# Patient Record
Sex: Male | Born: 1950 | Race: White | Hispanic: No | Marital: Single
Health system: Midwestern US, Community
[De-identification: ages and names within clinical notes are randomized; demographics above are authoritative.]

## PROBLEM LIST (undated history)

## (undated) DIAGNOSIS — I639 Cerebral infarction, unspecified: Secondary | ICD-10-CM

## (undated) DIAGNOSIS — I2510 Atherosclerotic heart disease of native coronary artery without angina pectoris: Secondary | ICD-10-CM

## (undated) DIAGNOSIS — K219 Gastro-esophageal reflux disease without esophagitis: Secondary | ICD-10-CM

## (undated) DIAGNOSIS — E7800 Pure hypercholesterolemia, unspecified: Secondary | ICD-10-CM

## (undated) DIAGNOSIS — F419 Anxiety disorder, unspecified: Secondary | ICD-10-CM

## (undated) DIAGNOSIS — I5189 Other ill-defined heart diseases: Secondary | ICD-10-CM

## (undated) DIAGNOSIS — I4891 Unspecified atrial fibrillation: Secondary | ICD-10-CM

## (undated) DIAGNOSIS — Z950 Presence of cardiac pacemaker: Secondary | ICD-10-CM

## (undated) HISTORY — DX: Anxiety disorder, unspecified: F41.9

## (undated) HISTORY — PX: OTHER SURGICAL HISTORY: SHX169

## (undated) HISTORY — PX: PACEMAKER INSERTION: SHX728

## (undated) HISTORY — DX: Atherosclerotic heart disease of native coronary artery without angina pectoris: I25.10

## (undated) HISTORY — DX: Pure hypercholesterolemia, unspecified: E78.00

## (undated) HISTORY — DX: Unspecified atrial fibrillation: I48.91

## (undated) HISTORY — PX: INGUINAL HERNIA REPAIR: SUR1180

## (undated) HISTORY — DX: Gastro-esophageal reflux disease without esophagitis: K21.9

## (undated) HISTORY — PX: CHOLECYSTECTOMY: SHX55

---

## 2012-07-23 ENCOUNTER — Emergency Department (HOSPITAL_COMMUNITY)
Admission: EM | Admit: 2012-07-23 | Discharge: 2012-07-23 | Disposition: A | Discharge status: Home / Self Care | Payer: Self-pay | Attending: Emergency Medicine | Admitting: Emergency Medicine

## 2012-07-23 ENCOUNTER — Other Ambulatory Visit: Payer: Self-pay

## 2012-07-23 ENCOUNTER — Encounter (HOSPITAL_COMMUNITY): Payer: Self-pay | Admitting: *Deleted

## 2012-07-23 ENCOUNTER — Emergency Department (HOSPITAL_COMMUNITY): Payer: Self-pay

## 2012-07-23 ENCOUNTER — Emergency Department (HOSPITAL_COMMUNITY)
Admission: EM | Admit: 2012-07-23 | Discharge: 2012-07-24 | Disposition: A | Discharge status: Home / Self Care | Payer: Self-pay | Attending: Emergency Medicine | Admitting: Emergency Medicine

## 2012-07-23 DIAGNOSIS — R55 Syncope and collapse: Secondary | ICD-10-CM | POA: Insufficient documentation

## 2012-07-23 DIAGNOSIS — Z9889 Other specified postprocedural states: Secondary | ICD-10-CM | POA: Insufficient documentation

## 2012-07-23 DIAGNOSIS — R079 Chest pain, unspecified: Secondary | ICD-10-CM | POA: Insufficient documentation

## 2012-07-23 DIAGNOSIS — R0602 Shortness of breath: Secondary | ICD-10-CM | POA: Insufficient documentation

## 2012-07-23 DIAGNOSIS — Z950 Presence of cardiac pacemaker: Secondary | ICD-10-CM | POA: Insufficient documentation

## 2012-07-23 DIAGNOSIS — Z7982 Long term (current) use of aspirin: Secondary | ICD-10-CM | POA: Insufficient documentation

## 2012-07-23 DIAGNOSIS — Z87891 Personal history of nicotine dependence: Secondary | ICD-10-CM | POA: Insufficient documentation

## 2012-07-23 DIAGNOSIS — R42 Dizziness and giddiness: Secondary | ICD-10-CM | POA: Insufficient documentation

## 2012-07-23 DIAGNOSIS — I252 Old myocardial infarction: Secondary | ICD-10-CM | POA: Insufficient documentation

## 2012-07-23 DIAGNOSIS — I2510 Atherosclerotic heart disease of native coronary artery without angina pectoris: Secondary | ICD-10-CM | POA: Insufficient documentation

## 2012-07-23 DIAGNOSIS — R404 Transient alteration of awareness: Secondary | ICD-10-CM | POA: Insufficient documentation

## 2012-07-23 HISTORY — DX: Atherosclerotic heart disease of native coronary artery without angina pectoris: I25.10

## 2012-07-23 LAB — CBC
HCT: 34.6 % — ABNORMAL LOW (ref 39.0–52.0)
Hemoglobin: 12.2 g/dL — ABNORMAL LOW (ref 13.0–17.0)
MCH: 29 pg (ref 26.0–34.0)
MCHC: 31.8 g/dL (ref 30.0–36.0)
MCHC: 31.8 g/dL (ref 30.0–36.0)
MCV: 91.5 fL (ref 78.0–100.0)
MCV: 91.4 fL (ref 78.0–100.0)
RDW: 14.5 % (ref 11.5–15.5)

## 2012-07-23 LAB — COMPREHENSIVE METABOLIC PANEL
Alkaline Phosphatase: 61 U/L (ref 39–117)
BUN: 14 mg/dL (ref 6–23)
Calcium: 9.2 mg/dL (ref 8.4–10.5)
GFR calc Af Amer: 88 mL/min — ABNORMAL LOW (ref >90)
GFR calc non Af Amer: 76 mL/min — ABNORMAL LOW (ref >90)
Glucose, Bld: 124 mg/dL — ABNORMAL HIGH (ref 70–99)
Total Protein: 7.1 g/dL (ref 6.0–8.3)

## 2012-07-23 LAB — BASIC METABOLIC PANEL
BUN: 13 mg/dL (ref 6–23)
Calcium: 9.3 mg/dL (ref 8.4–10.5)
Creatinine, Ser: 0.97 mg/dL (ref 0.50–1.35)
GFR calc non Af Amer: 86 mL/min — ABNORMAL LOW (ref >90)
Glucose, Bld: 101 mg/dL — ABNORMAL HIGH (ref 70–99)
Sodium: 140 mEq/L (ref 135–145)

## 2012-07-23 LAB — POCT I-STAT TROPONIN I
Troponin i, poc: 0 ng/mL (ref 0.00–0.08)
Troponin i, poc: 0 ng/mL (ref 0.00–0.08)

## 2012-07-23 MED ORDER — FENTANYL CITRATE 0.05 MG/ML IJ SOLN
100.0000 ug | Freq: Once | INTRAMUSCULAR | Status: AC
  Administered 2012-07-23: 100 ug via INTRAVENOUS
  Filled 2012-07-23: qty 2

## 2012-07-23 MED ORDER — ASPIRIN 325 MG PO TABS
325.0000 mg | ORAL_TABLET | Freq: Once | ORAL | Status: AC
  Administered 2012-07-23: 325 mg via ORAL
  Filled 2012-07-23: qty 1

## 2012-07-23 MED ORDER — NITROGLYCERIN 0.4 MG SL SUBL
0.4000 mg | SUBLINGUAL_TABLET | SUBLINGUAL | Status: DC | PRN
  Administered 2012-07-23: 0.4 mg via SUBLINGUAL
  Filled 2012-07-23: qty 25

## 2012-07-23 MED ORDER — OXYCODONE-ACETAMINOPHEN 5-325 MG PO TABS
1.0000 | ORAL_TABLET | Freq: Once | ORAL | Status: AC
  Administered 2012-07-23: 1 via ORAL
  Filled 2012-07-23: qty 1

## 2012-07-23 MED ORDER — ONDANSETRON HCL 4 MG/2ML IJ SOLN
4.0000 mg | Freq: Once | INTRAMUSCULAR | Status: AC
  Administered 2012-07-23: 4 mg via INTRAVENOUS
  Filled 2012-07-23: qty 2

## 2012-07-23 NOTE — ED Provider Notes (Signed)
History     CSN: [id number]  Arrival date & time 07/23/12  1422   None     Chief Complaint  Patient presents with  . Loss of Consciousness  . Chest Pain   HPI chief complaint chest pain and syncope. Onset: Upon waking this morning in regards to his chest pain. Location: Home. Symptoms not improved or worsened by anything. Quality dull. No radiation. Severity mild. Timing constant in regards to chest pain. Duration greater than 8 hours. Patient denies associated shortness of breath, palpitations, leg pain, cough, hemoptysis. Regarding social history see nurse's notes. I have reviewed patient's past medical, past surgical, social history as well as medications and allergies.  Past Medical History  Diagnosis Date  . Coronary artery disease     Past Surgical History  Procedure Date  . Pacemaker insertion   . Cardiac stents times 2   . Cholecystectomy     No family history on file.  History  Substance Use Topics  . Smoking status: Former Games developer  . Smokeless tobacco: Not on file  . Alcohol Use: No      Review of Systems 10 Systems reviewed and are negative for acute change except as noted in the HPI.  Allergies  Review of patient's allergies indicates no known allergies.  Home Medications  No current outpatient prescriptions on file.  BP 121/74  Pulse 74  Temp 98 F (36.7 C) (Oral)  Resp 18  SpO2 99%  Physical Exam  Constitutional: He is oriented to person, place, and time. He appears well-developed and well-nourished. No distress.  HENT:  Head: Normocephalic.  Eyes: Conjunctivae normal are normal.  Neck: Normal range of motion. Neck supple. No JVD present.  Cardiovascular: Normal rate, regular rhythm, normal heart sounds and intact distal pulses.   No murmur heard. Pulmonary/Chest: Effort normal and breath sounds normal. No respiratory distress. He has no wheezes. He has no rales. He exhibits no tenderness.  Abdominal: Soft. Bowel sounds are normal. He  exhibits no distension and no mass. There is no tenderness. There is no rebound and no guarding.  Musculoskeletal: Normal range of motion. He exhibits no edema and no tenderness.  Neurological: He is alert and oriented to person, place, and time.  Skin: Skin is warm and dry. He is not diaphoretic.  Psychiatric: He has a normal mood and affect.    ED Course  Procedures (including critical care time)  Labs Reviewed  CBC - Abnormal; Notable for the following:    RBC 3.78 (*)     Hemoglobin 11.0 (*)     HCT 34.6 (*)     All other components within normal limits  PRO B NATRIURETIC PEPTIDE - Abnormal; Notable for the following:    Pro B Natriuretic peptide (BNP) 321.0 (*)     All other components within normal limits  COMPREHENSIVE METABOLIC PANEL - Abnormal; Notable for the following:    Glucose, Bld 124 (*)     GFR calc non Af Amer 76 (*)     GFR calc Af Amer 88 (*)     All other components within normal limits  POCT I-STAT TROPONIN I  D-DIMER, QUANTITATIVE  POCT I-STAT TROPONIN I   Dg Chest 2 View  07/23/2012  *RADIOLOGY REPORT*  Clinical Data: Loss of consciousness.  Chest pain  CHEST - 2 VIEW  Comparison: None  Findings: The left chest wall pacer device is noted with lead in the right atrial appendage and right ventricle.  Heart size is normal.  There is no pleural effusion or edema.  No airspace consolidation identified.  Review of the visualized osseous structures is unremarkable.  IMPRESSION: No active cardiopulmonary abnormality.   Original Report Authenticated By: Signa Kell, M.D.      1. Chest pain   2. Syncope      EKG reviewed and interpreted: Atrial paced rhythm at. Normal axis. Normal intervals. T wave flattening in aVL. No ST segment changes. Normal QT interval. No evidence of WPW or Brugada syndrome. MDM  Patient is a very well-appearing 61 year old male with the reported history of myocardial infarction and stents but per her records requested from [facility]  patient has no such history. 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 Patient presents today with chest pain upon waking early this morning into syncopal episode while sitting on the couch. There is a pleuritic component to the patient's pain. Pain improved on ED. D-dimer negative so pulmonary embolism in this low-risk patient felt to be unlikely. Patient's pacemaker was interrogated which is no tenderness throughout today. Dissection doubtful. Unknown cause the patient's syncope but not felt to be cardiogenic. No new murmurs. No history of hypoglycemia. Patient denies headache so a subarachnoid hemorrhage is very doubtful at this time. No history of seizures and patient had a negative EEG several years ago. No tongue biting and incontinence although these are relatively insensitive findings. No abdominal pain. A ruptured AAA is doubtful at this time. Given chest pain has been present for more than 8 hours and patient's had 2 negative troponins I feel acute coronary syndrome is very doubtful at this time. Patient instructed to follow up with his regular physician this week and return to the emergency department should his symptoms worsen and as needed. Patient verbalized understanding.        Consuello Masse, MD 07/24/12 [phone]

## 2012-07-23 NOTE — ED Provider Notes (Signed)
I saw and evaluated the patient, reviewed the resident's note and I agree with the findings including ECG and plan.  61 year old male with history coronary artery disease and pacemaker placed a few years ago in [facility], he moved to [place] about a year ago and wants to switch cardiologist [place], he does not usually have anginal symptoms, he woke up today with a constant atypical chest sharp stabbing pain worse with cough and worse with deep breathing present all day long but when it became sharp and severe he became lightheaded and passed out briefly twice and has had mild shortness breath all day with minimal if any cough no fever no body aches no rash no confusion no focal weakness or numbness or incoordination. His chest pain is sharp stabbing pleuritic constant all day long and mild now. His pacemaker was interrogated with no abnormalities noted.  Jacob Horn, MD 07/25/12 [phone]

## 2012-07-23 NOTE — ED Notes (Signed)
Fonnie Jarvis, MD notified re: medtronic interrogation results within normal limits

## 2012-07-23 NOTE — ED Provider Notes (Signed)
History     CSN: [id number]  Arrival date & time 07/23/12  2008   First MD Initiated Contact with Patient 07/23/12 2056      Chief Complaint  Patient presents with  . Loss of Consciousness  . Chest Pain  . Dizziness  . Shortness of Breath     HPI The patient was seen earlier this evening at [facility] first syncopal episode.  There he had labs and an x-ray performed.  His pacemaker was interrogated and showed no signs of arrhythmia the patient a normal set of labs and 2 sets of troponins.  He states that after being discharged home from the emergency department he went home and felt lightheaded and passed out again.  He denies melena or hematochezia.  He has had some exertional shortness of breath but is also had chronic shortness of breath.  He denies weakness of his upper lower extremities.  He has no other complaints.   Past Medical History  Diagnosis Date  . Coronary artery disease     Past Surgical History  Procedure Date  . Pacemaker insertion   . Cardiac stents times 2   . Cholecystectomy     History reviewed. No pertinent family history.  History  Substance Use Topics  . Smoking status: Former Games developer  . Smokeless tobacco: Not on file  . Alcohol Use: No      Review of Systems  All other systems reviewed and are negative.    Allergies  Review of patient's allergies indicates no known allergies.  Home Medications   Current Outpatient Rx  Name  Route  Sig  Dispense  Refill  . ASPIRIN EC 81 MG PO TBEC   Oral   Take 81 mg by mouth daily.           BP 138/82  Pulse 71  Temp 98.2 F (36.8 C) (Oral)  Resp 14  SpO2 99%  Physical Exam  Nursing note and vitals reviewed. Constitutional: He is oriented to person, place, and time. He appears well-developed and well-nourished.  HENT:  Head: Normocephalic and atraumatic.  Eyes: EOM are normal.  Neck: Normal range of motion.  Cardiovascular: Normal rate, regular rhythm, normal heart sounds and  intact distal pulses.   Pulmonary/Chest: Effort normal and breath sounds normal. No respiratory distress.  Abdominal: Soft. He exhibits no distension. There is no tenderness.  Musculoskeletal: Normal range of motion.  Neurological: He is alert and oriented to person, place, and time.  Skin: Skin is warm and dry.  Psychiatric: He has a normal mood and affect. Judgment normal.    ED Course  Procedures (including critical care time)   Date: 07/23/2012  Rate: 72  Rhythm: Atrial paced rhythm  QRS Axis: normal  Intervals: normal  ST/T Wave abnormalities: normal  Conduction Disutrbances: none  Narrative Interpretation:   Old EKG Reviewed: No significant changes noted     Labs Reviewed  CBC - Abnormal; Notable for the following:    RBC 4.20 (*)     Hemoglobin 12.2 (*)     HCT 38.4 (*)     All other components within normal limits  BASIC METABOLIC PANEL - Abnormal; Notable for the following:    Glucose, Bld 101 (*)     GFR calc non Af Amer 86 (*)     All other components within normal limits  TROPONIN I   Dg Chest 2 View  07/23/2012  *RADIOLOGY REPORT*  Clinical Data: Loss of consciousness.  Chest pain  CHEST - 2 VIEW  Comparison: None  Findings: The left chest wall pacer device is noted with lead in the right atrial appendage and right ventricle.  Heart size is normal.  There is no pleural effusion or edema.  No airspace consolidation identified.  Review of the visualized osseous structures is unremarkable.  IMPRESSION: No active cardiopulmonary abnormality.   Original Report Authenticated By: Signa Kell, M.D.    Dg Chest Port 1 View  07/23/2012  *RADIOLOGY REPORT*  Clinical Data: Loss of consciousness, chest pain, short of breath  PORTABLE CHEST - 1 VIEW  Comparison: Chest radiograph 07/23/2012  Findings: Left-sided pacemaker with two continuous leads overlie normal cardiac silhouette.  No effusion, infiltrate, or pneumothorax.  IMPRESSION: No acute cardiopulmonary process.    Original Report Authenticated By: Genevive Bi, M.D.    I personally reviewed the imaging tests through PACS system I reviewed available ER/hospitalization records through the EMR   No diagnosis found.    MDM  There is no indication for additional workup in the emergency department.  His labs and x-ray are normal.  He ambulated in the emergency department without difficulty.  His had no arrhythmias on the monitor.  Close PCP and cardiology followup        Lyanne Co, MD 07/23/12 2359

## 2012-07-23 NOTE — ED Notes (Signed)
Pt returned home from hospital and reports becoming syncopal upon entering home. Pt has cardiac hx and has a pacemaker. Pt c/o chest pain starting last night which he reported to ED on earlier visit. Pt c/o shortness of breath w/ mild exertion. Pt c/o dizziness when sitting and is presently dizzy.

## 2012-07-23 NOTE — ED Notes (Signed)
Pt reports that he was seen at [facility] earlier this afternoon for chest pain and syncope; pt was discharged and that he went home and had another syncopal episode and continues to have chest pain.

## 2012-07-23 NOTE — ED Notes (Signed)
Pt has pacemaker and 2 stents.  Pt has been having dizziness today and has passed out twice.  Pt states both times was incontinent of urine.  Pt reports chest pain and shortness of breath

## 2012-11-26 ENCOUNTER — Emergency Department (HOSPITAL_COMMUNITY): Payer: Self-pay

## 2012-11-26 ENCOUNTER — Encounter (HOSPITAL_COMMUNITY): Payer: Self-pay | Admitting: Emergency Medicine

## 2012-11-26 ENCOUNTER — Emergency Department (HOSPITAL_COMMUNITY)
Admission: EM | Admit: 2012-11-26 | Discharge: 2012-11-26 | Disposition: A | Discharge status: Home / Self Care | Payer: Self-pay | Attending: Emergency Medicine | Admitting: Emergency Medicine

## 2012-11-26 DIAGNOSIS — R197 Diarrhea, unspecified: Secondary | ICD-10-CM | POA: Insufficient documentation

## 2012-11-26 DIAGNOSIS — Z87891 Personal history of nicotine dependence: Secondary | ICD-10-CM | POA: Insufficient documentation

## 2012-11-26 DIAGNOSIS — Z7982 Long term (current) use of aspirin: Secondary | ICD-10-CM | POA: Insufficient documentation

## 2012-11-26 DIAGNOSIS — Z950 Presence of cardiac pacemaker: Secondary | ICD-10-CM | POA: Insufficient documentation

## 2012-11-26 DIAGNOSIS — R42 Dizziness and giddiness: Secondary | ICD-10-CM | POA: Insufficient documentation

## 2012-11-26 DIAGNOSIS — R1110 Vomiting, unspecified: Secondary | ICD-10-CM | POA: Insufficient documentation

## 2012-11-26 DIAGNOSIS — R0602 Shortness of breath: Secondary | ICD-10-CM | POA: Insufficient documentation

## 2012-11-26 DIAGNOSIS — R059 Cough, unspecified: Secondary | ICD-10-CM | POA: Insufficient documentation

## 2012-11-26 DIAGNOSIS — R05 Cough: Secondary | ICD-10-CM | POA: Insufficient documentation

## 2012-11-26 DIAGNOSIS — R55 Syncope and collapse: Secondary | ICD-10-CM | POA: Insufficient documentation

## 2012-11-26 DIAGNOSIS — I2510 Atherosclerotic heart disease of native coronary artery without angina pectoris: Secondary | ICD-10-CM | POA: Insufficient documentation

## 2012-11-26 LAB — BASIC METABOLIC PANEL
BUN: 10 mg/dL (ref 6–23)
Calcium: 9 mg/dL (ref 8.4–10.5)
Creatinine, Ser: 1.28 mg/dL (ref 0.50–1.35)
GFR calc Af Amer: 67 mL/min — ABNORMAL LOW (ref >90)
GFR calc non Af Amer: 58 mL/min — ABNORMAL LOW (ref >90)

## 2012-11-26 LAB — POCT I-STAT TROPONIN I

## 2012-11-26 LAB — CBC
HCT: 38.9 % — ABNORMAL LOW (ref 39.0–52.0)
MCH: 29.5 pg (ref 26.0–34.0)
MCHC: 33.9 g/dL (ref 30.0–36.0)
MCV: 87 fL (ref 78.0–100.0)
RDW: 14.1 % (ref 11.5–15.5)

## 2012-11-26 MED ORDER — SODIUM CHLORIDE 0.9 % IV BOLUS (SEPSIS)
1000.0000 mL | Freq: Once | INTRAVENOUS | Status: AC
  Administered 2012-11-26: 1000 mL via INTRAVENOUS

## 2012-11-26 MED ORDER — ONDANSETRON HCL 4 MG/2ML IJ SOLN
4.0000 mg | Freq: Once | INTRAMUSCULAR | Status: AC
  Administered 2012-11-26: 4 mg via INTRAVENOUS
  Filled 2012-11-26: qty 2

## 2012-11-26 NOTE — ED Notes (Signed)
Pt c/o CP and SOB today; pt sts syncopal episode x 2 today; pt sts some dizziness

## 2012-11-26 NOTE — ED Notes (Signed)
Patient said he started having chest pain last night and he thought it was going to go away.  His pain got severe, he had SOB, Dizziness, Diaphoresis, weakness, nausea, vomiting and diarrhea.  The patient said he had LOC today and 1200 and he also peed on himself.  The patient decided to call a friend and they brought him to the ED to be evaluated.

## 2012-11-26 NOTE — ED Provider Notes (Signed)
History     CSN: [id number]  Arrival date & time 11/26/12  1726   First MD Initiated Contact with Patient 11/26/12 1757      Chief Complaint  Patient presents with  . Chest Pain  . Shortness of Breath    (Consider location/radiation/quality/duration/timing/severity/associated sxs/prior treatment) Patient is a 62 y.o. male presenting with chest pain and shortness of breath.  Chest Pain Associated symptoms: shortness of breath   Shortness of Breath Associated symptoms: chest pain    Pt report onset of vomiting and diarrhea yesterday with pleuritic chest pain worse with cough. No fever or abdominal pain. He reports syncope x 2 at home earlier today. Last emesis was several hours prior to arrival. He has reported history of CAD s/p stents at [facility] and pacemaker placement for bradycardia several years ago. He was evaluated here for similar symptoms earlier this year and again the same day at [facility].   Past Medical History  Diagnosis Date  . Coronary artery disease     Past Surgical History  Procedure Laterality Date  . Pacemaker insertion    . Cardiac stents times 2    . Cholecystectomy      History reviewed. No pertinent family history.  History  Substance Use Topics  . Smoking status: Former Games developer  . Smokeless tobacco: Not on file  . Alcohol Use: No      Review of Systems  Respiratory: Positive for shortness of breath.   Cardiovascular: Positive for chest pain.   All other systems reviewed and are negative except as noted in HPI.   Allergies  Review of patient's allergies indicates no known allergies.  Home Medications   Current Outpatient Rx  Name  Route  Sig  Dispense  Refill  . acetaminophen (TYLENOL) 500 MG tablet   Oral   Take 1,000 mg by mouth daily as needed for pain.         Marland Kitchen aspirin EC 81 MG tablet   Oral   Take 81 mg by mouth daily.         . Multiple Vitamin (MULTIVITAMIN WITH MINERALS) TABS   Oral   Take 1 tablet by mouth daily.            BP 110/75  Pulse 72  Temp(Src) 98.2 F (36.8 C) (Oral)  Resp 16  SpO2 97%  Physical Exam  Nursing note and vitals reviewed. Constitutional: He is oriented to person, place, and time. He appears well-developed and well-nourished.  HENT:  Head: Normocephalic and atraumatic.  Eyes: EOM are normal. Pupils are equal, round, and reactive to light.  Neck: Normal range of motion. Neck supple.  Cardiovascular: Normal rate, normal heart sounds and intact distal pulses.   Pulmonary/Chest: Effort normal and breath sounds normal.  Abdominal: Bowel sounds are normal. He exhibits no distension. There is no tenderness.  Musculoskeletal: Normal range of motion. He exhibits no edema and no tenderness.  Neurological: He is alert and oriented to person, place, and time. He has normal strength. No cranial nerve deficit or sensory deficit.  Skin: Skin is warm and dry. No rash noted.  Psychiatric: He has a normal mood and affect.    ED Course  Procedures (including critical care time)  Labs Reviewed  CBC - Abnormal; Notable for the following:    HCT 38.9 (*)    All other components within normal limits  BASIC METABOLIC PANEL - Abnormal; Notable for the following:    Glucose, Bld 138 (*)    GFR  calc non Af Amer 58 (*)    GFR calc Af Amer 67 (*)    All other components within normal limits  POCT I-STAT TROPONIN I  POCT I-STAT TROPONIN I   Dg Chest 2 View  11/26/2012  *RADIOLOGY REPORT*  Clinical Data: Chest pain, short of breath  CHEST - 2 VIEW  Comparison: 07/23/2012  Findings: Left-sided pacemaker overlies normal cardiac silhouette. No effusion, infiltrate, or pneumothorax.  IMPRESSION: No acute cardiopulmonary process.   Original Report Authenticated By: Genevive Bi, M.D.      1. Syncope   2. Diarrhea       MDM   Date: 11/26/2012  Rate: 78  Rhythm: atrially paced  QRS Axis: normal  Intervals: normal  ST/T Wave abnormalities: normal  Conduction Disutrbances:none   Narrative Interpretation:   Old EKG Reviewed: unchanged  Pt with vomiting, diarrhea and pleuritic chest pain. Report syncope earlier today, not orthostatic. Pacer interrogated with no dysrhythmias as the cause. Plan second troponin, care signed out to Earley Favor, NP and Dr. Bebe Shaggy at shift change.         Telesforo Brosnahan B. Bernette Mayers, MD 11/28/12 [phone]

## 2012-11-26 NOTE — ED Notes (Signed)
Patient transported to X-ray 

## 2012-11-26 NOTE — ED Provider Notes (Signed)
  Physical Exam  BP 110/75  Pulse 72  Temp(Src) 98.2 F (36.8 C) (Oral)  Resp 16  SpO2 97%  Physical Exam S. review second troponin if negative patient is to be sent to followup with her primary care physician ED Course  Procedures  MDM Second troponin is negative, will be discharged home      Arman Filter, NP 11/26/12 2049

## 2013-02-01 LAB — METABOLIC PANEL, COMPREHENSIVE
A-G Ratio: 1 — ABNORMAL LOW (ref 1.2–3.5)
ALT (SGPT): 18 U/L (ref 12–65)
AST (SGOT): 25 U/L (ref 15–37)
Albumin: 3.6 g/dL (ref 3.2–4.6)
Alk. phosphatase: 70 U/L (ref 50–136)
Anion gap: 5 mmol/L — ABNORMAL LOW (ref 7–16)
BUN: 12 MG/DL (ref 8–23)
Bilirubin, total: 0.6 MG/DL (ref 0.2–1.1)
CO2: 25 mmol/L (ref 21–32)
Calcium: 8.9 MG/DL (ref 8.3–10.4)
Chloride: 110 mmol/L — ABNORMAL HIGH (ref 98–107)
Creatinine: 1.43 MG/DL (ref 0.8–1.5)
GFR est AA: >60 mL/min/{1.73_m2} (ref >60)
GFR est non-AA: 53 mL/min/{1.73_m2} — ABNORMAL LOW (ref >60)
Globulin: 3.5 g/dL (ref 2.3–3.5)
Glucose: 98 mg/dL (ref 65–100)
Potassium: 4.7 mmol/L (ref 3.5–5.1)
Protein, total: 7.1 g/dL (ref 6.3–8.2)
Sodium: 140 mmol/L (ref 136–145)

## 2013-02-01 LAB — CBC WITH AUTOMATED DIFF
ABS. BASOPHILS: 0 10*3/uL (ref 0.0–0.2)
ABS. EOSINOPHILS: 0.1 10*3/uL (ref 0.0–0.8)
ABS. IMM. GRANS.: 0 10*3/uL (ref 0.0–0.5)
ABS. LYMPHOCYTES: 1.6 10*3/uL (ref 0.5–4.6)
ABS. MONOCYTES: 0.5 10*3/uL (ref 0.1–1.3)
ABS. NEUTROPHILS: 4.6 10*3/uL (ref 1.7–8.2)
BASOPHILS: 0 % (ref 0.0–2.0)
EOSINOPHILS: 2 % (ref 0.5–7.8)
HCT: 39.6 % — ABNORMAL LOW (ref 41.1–50.3)
HGB: 13.1 g/dL — ABNORMAL LOW (ref 13.6–17.2)
IMMATURE GRANULOCYTES: 0.1 % (ref 0.0–5.0)
LYMPHOCYTES: 24 % (ref 13–44)
MCH: 29.6 PG (ref 26.1–32.9)
MCHC: 33.1 g/dL (ref 31.4–35.0)
MCV: 89.4 FL (ref 79.6–97.8)
MONOCYTES: 7 % (ref 4.0–12.0)
MPV: 10.5 FL — ABNORMAL LOW (ref 10.8–14.1)
NEUTROPHILS: 67 % (ref 43–78)
PLATELET: 243 10*3/uL (ref 150–450)
RBC: 4.43 M/uL (ref 4.23–5.67)
RDW: 14.1 % (ref 11.9–14.6)
WBC: 6.9 10*3/uL (ref 4.3–11.1)

## 2013-02-01 LAB — POC TROPONIN: Troponin-I (POC): 0 ng/ml (ref 0.0–0.08)

## 2013-02-01 MED ADMIN — aspirin (ASPIRIN) tablet 325 mg: ORAL | @ 18:00:00 | NDC 66553000101

## 2013-02-01 MED ADMIN — HYDROcodone-acetaminophen (NORCO) 5-325 mg per tablet 1 Tab: ORAL | @ 18:00:00 | NDC 68084036811

## 2013-02-01 MED ADMIN — albuterol (PROVENTIL VENTOLIN) nebulizer solution 5 mg: RESPIRATORY_TRACT | @ 18:00:00 | NDC 00487950101

## 2013-02-01 MED ADMIN — methylPREDNISolone (PF) (SOLU-MEDROL) injection 125 mg: INTRAVENOUS | @ 18:00:00 | NDC 00009004725

## 2013-02-01 MED FILL — ALBUTEROL SULFATE 0.083 % (0.83 MG/ML) SOLN FOR INHALATION: 2.5 mg /3 mL (0.083 %) | RESPIRATORY_TRACT | Qty: 2

## 2013-02-01 NOTE — ED Notes (Signed)
Oxygen applied as order via nasal cannula

## 2013-02-01 NOTE — ED Notes (Signed)
I have reviewed discharge instructions with the patient.  The patient verbalized understanding. Prescriptions given. Patient ambulated out of ER with no acute distress.

## 2013-02-01 NOTE — ED Notes (Signed)
Troponin redrawn, ekg done.  Resting in bed.  Reports increased pain.  Dr. Mikey Bussing aware.

## 2013-02-01 NOTE — ED Notes (Signed)
Patient resting in bed.

## 2013-02-01 NOTE — ED Notes (Signed)
Patient requested more pain meds.  Dr. Mikey Bussing aware.  No new orders at this time.

## 2013-02-01 NOTE — ED Notes (Signed)
Medication given as ordered.

## 2013-02-01 NOTE — ED Notes (Signed)
Pt reports chest pain since this AM, some pain with inspiration and SOB.

## 2013-02-01 NOTE — ED Provider Notes (Addendum)
HPI Comments: 62 yo male with SOB and chest pain with inspiration  Better with rest  No fever  No chills  Cough - non-productive    No trauma    Pt has hx of pacer-- it was checked 2 weeks ago and fine    Patient is a 62 y.o. male presenting with chest pain. The history is provided by the patient.   Chest Pain (Angina)   Episode onset: hours ago. The problem has been gradually worsening. The pain is associated with exertion. The pain is moderate. The quality of the pain is described as sharp. The pain does not radiate. The symptoms are aggravated by deep breathing. Associated symptoms include shortness of breath. Pertinent negatives include no nausea and no vomiting.        Past Medical History   Diagnosis Date   ??? CAD (coronary artery disease)         Past Surgical History   Procedure Laterality Date   ??? Hx pacemaker     ??? Hx cholecystectomy           History reviewed. No pertinent family history.     History     Social History   ??? Marital Status: SINGLE     Spouse Name: N/A     Number of Children: N/A   ??? Years of Education: N/A     Occupational History   ??? Not on file.     Social History Main Topics   ??? Smoking status: Never Smoker    ??? Smokeless tobacco: Never Used   ??? Alcohol Use: No   ??? Drug Use: No   ??? Sexually Active: Not on file     Other Topics Concern   ??? Not on file     Social History Narrative   ??? No narrative on file                  ALLERGIES: Review of patient's allergies indicates no known allergies.      Review of Systems   Constitutional: Negative.    Respiratory: Positive for chest tightness and shortness of breath.    Cardiovascular: Positive for chest pain.   Gastrointestinal: Negative.  Negative for nausea and vomiting.   Skin: Negative for pallor.       Filed Vitals:    02/01/13 1300 02/01/13 1344   BP: 118/94    Pulse: 71    Temp: 97.2 ??F (36.2 ??C)    Resp: 18    Height: 5\' 9"  (1.753 m)    Weight: 81.647 kg (180 lb)    SpO2: 99% 98%            Physical Exam   Vitals reviewed.   Constitutional: He is oriented to person, place, and time. He appears well-developed and well-nourished.   HENT:   Head: Normocephalic.   Eyes: Pupils are equal, round, and reactive to light.   Cardiovascular: Normal rate and regular rhythm.    Pulmonary/Chest: No respiratory distress. He has wheezes. He has no rales. He exhibits tenderness.   Abdominal: Soft. He exhibits no distension. There is no tenderness.   Musculoskeletal: Normal range of motion.   Neurological: He is alert and oriented to person, place, and time.   Skin: Skin is warm and dry.        MDM     Differential Diagnosis; Clinical Impression; Plan:     62 yo with chest wall pain worse with inspiration    No alleviating  No EKG changes  Trop negative    Xr Chest [facility]    02/01/2013  PORTABLE CHEST, February 01, 2013 at 1325 hours  CLINICAL HISTORY:  CHEST pain.  COMPARISON:  None.  FINDINGS:  AP erect image demonstrates no confluent infiltrate or significant pleural fluid.  The heart size is within normal limits without evidence of congestive heart failure or pneumothorax.  The bony thorax appears intact on this view.  Bipolar transvenous pacemaker is in place.      02/01/2013  IMPRESSION:  NO ACUTE CARDIO PULMONARY DISEASE IDENTIFIED.      Recent Results (from the past 8 hour(s))  -EKG, 12 LEAD, INITIAL       Collection Time 02/01/13  1:01 PM     Systolic BP mmHg                                     Diastolic BP mmHg                                    Ventricular Rate BPM          72                     Atrial Rate BPM               72                     P-R Interval ms               82                     QRS Duration ms               82                     Q-T Interval ms               364                    QTC Calculation (Bezet) ms    398                    Calculated P Axis degrees     26                     Calculated R Axis degrees     31                     Calculated T Axis degrees     50                     Diagnosis                                          -METABOLIC PANEL, COMPREHENSIVE       Collection Time 02/01/13  1:15 PM     Sodium mmol/L                 140     Range: 136 - 145 mmol/L     Potassium mmol/L  4.7     Range: 3.5 - 5.1 mmol/L     Chloride mmol/L               110 (*) Range: 98 - 107 mmol/L     CO2 mmol/L                    25      Range: 21 - 32 mmol/L     Anion gap mmol/L              5 (*)   Range: 7 - 16 mmol/L     Glucose mg/dL                 98      Range: 65 - 100 mg/dL     BUN MG/DL                     12      Range: 8 - 23 MG/DL     Creatinine MG/DL              1.61    Range: 0.8 - 1.5 MG/DL     GFR est AA WR/UEA/5.40J8      >60     Range: >60 ml/min/1.72m2     GFR est non-AA ml/min/1.64m2  53 (*)  Range: >60 ml/min/1.7m2     Calcium MG/DL                 8.9     Range: 8.3 - 10.4 MG/DL     Bilirubin, total MG/DL        0.6     Range: 0.2 - 1.1 MG/DL     ALT U/L                       18      Range: 12 - 65 U/L     AST U/L                       25      Range: 15 - 37 U/L     Alk. phosphatase U/L          70      Range: 50 - 136 U/L     Protein, total g/dL           7.1     Range: 6.3 - 8.2 g/dL     Albumin g/dL                  3.6     Range: 3.2 - 4.6 g/dL     Globulin g/dL                 3.5     Range: 2.3 - 3.5 g/dL     A-G Ratio                     1.0 (*) Range: 1.2 - 3.5    -CBC WITH AUTOMATED DIFF       Collection Time 02/01/13  1:44 PM     WBC K/uL                      6.9     Range: 4.3 - 11.1 K/uL     RBC M/uL  4.43    Range: 4.23 - 5.67 M/uL     HGB g/dL                      16.1 (*)Range: 13.6 - 17.2 g/dL     HCT %                         39.6 (*)Range: 41.1 - 50.3 %     MCV FL                        89.4    Range: 79.6 - 97.8 FL     MCH PG                        29.6    Range: 26.1 - 32.9 PG     MCHC g/dL                     09.6    Range: 31.4 - 35.0 g/dL     RDW %                         14.1    Range: 11.9 - 14.6 %     PLATELET K/uL                 243     Range: 150 - 450 K/uL      MPV FL                        10.5 (*)Range: 10.8 - 14.1 FL     DF                            AUTOMATED                NEUTROPHILS %                 67      Range: 43 - 78 %     LYMPHOCYTES %                 24      Range: 13 - 44 %     MONOCYTES %                   7       Range: 4.0 - 12.0 %     EOSINOPHILS %                 2       Range: 0.5 - 7.8 %     BASOPHILS %                   0       Range: 0.0 - 2.0 %     IMMATURE GRANULOCYTES %       0.1     Range: 0.0 - 5.0 %     ABS. NEUTROPHILS K/UL         4.6     Range: 1.7 - 8.2 K/UL     ABS. LYMPHOCYTES K/UL         1.6     Range: 0.5 - 4.6 K/UL     ABS. MONOCYTES K/UL  0.5     Range: 0.1 - 1.3 K/UL     ABS. EOSINOPHILS K/UL         0.1     Range: 0.0 - 0.8 K/UL     ABS. BASOPHILS K/UL           0.0     Range: 0.0 - 0.2 K/UL     ABS. IMM. GRANS. K/UL         0.0     Range: 0.0 - 0.5 K/UL    Repeat trop and EKG    He does have some wheezing, no infiltrate      Procedures    Recheck -- pt resting  No fever  No chills  Now with pain radiating from the left chest, down in to the LLQ  Will repeat trop, and EKG-- pt not diaphoretic, no nausea, no EKG change  -- I think he is low risk    Will treat pain, refer back to PMD

## 2013-02-02 LAB — EKG, 12 LEAD, INITIAL
Atrial Rate: 72 {beats}/min
Calculated P Axis: 26 degrees
Calculated R Axis: 31 degrees
Calculated T Axis: 50 degrees
P-R Interval: 82 ms
Q-T Interval: 364 ms
QRS Duration: 82 ms
QTC Calculation (Bezet): 398 ms
Ventricular Rate: 72 {beats}/min

## 2013-02-02 LAB — EKG, 12 LEAD, SUBSEQUENT
Atrial Rate: 468 {beats}/min
Calculated R Axis: 13 degrees
Calculated T Axis: 30 degrees
Q-T Interval: 380 ms
QRS Duration: 96 ms
QTC Calculation (Bezet): 412 ms
Ventricular Rate: 71 {beats}/min

## 2013-07-24 ENCOUNTER — Other Ambulatory Visit: Payer: Self-pay

## 2013-07-24 ENCOUNTER — Inpatient Hospital Stay (HOSPITAL_COMMUNITY)
Admission: EM | Admit: 2013-07-24 | Discharge: 2013-07-26 | DRG: 287 | Disposition: A | Discharge status: Home / Self Care | Payer: Self-pay | Attending: Internal Medicine | Admitting: Internal Medicine

## 2013-07-24 ENCOUNTER — Encounter (HOSPITAL_COMMUNITY): Payer: Self-pay | Admitting: Emergency Medicine

## 2013-07-24 ENCOUNTER — Emergency Department (HOSPITAL_COMMUNITY): Payer: Self-pay

## 2013-07-24 DIAGNOSIS — R0789 Other chest pain: Principal | ICD-10-CM | POA: Diagnosis present

## 2013-07-24 DIAGNOSIS — Z87898 Personal history of other specified conditions: Secondary | ICD-10-CM

## 2013-07-24 DIAGNOSIS — Z950 Presence of cardiac pacemaker: Secondary | ICD-10-CM

## 2013-07-24 DIAGNOSIS — I959 Hypotension, unspecified: Secondary | ICD-10-CM | POA: Diagnosis present

## 2013-07-24 DIAGNOSIS — Z8679 Personal history of other diseases of the circulatory system: Secondary | ICD-10-CM

## 2013-07-24 DIAGNOSIS — Z9861 Coronary angioplasty status: Secondary | ICD-10-CM

## 2013-07-24 DIAGNOSIS — R55 Syncope and collapse: Secondary | ICD-10-CM

## 2013-07-24 DIAGNOSIS — R42 Dizziness and giddiness: Secondary | ICD-10-CM

## 2013-07-24 DIAGNOSIS — Z87891 Personal history of nicotine dependence: Secondary | ICD-10-CM

## 2013-07-24 DIAGNOSIS — I2510 Atherosclerotic heart disease of native coronary artery without angina pectoris: Secondary | ICD-10-CM

## 2013-07-24 DIAGNOSIS — N182 Chronic kidney disease, stage 2 (mild): Secondary | ICD-10-CM | POA: Diagnosis present

## 2013-07-24 DIAGNOSIS — I200 Unstable angina: Secondary | ICD-10-CM

## 2013-07-24 DIAGNOSIS — R079 Chest pain, unspecified: Secondary | ICD-10-CM

## 2013-07-24 DIAGNOSIS — Z7982 Long term (current) use of aspirin: Secondary | ICD-10-CM

## 2013-07-24 DIAGNOSIS — I129 Hypertensive chronic kidney disease with stage 1 through stage 4 chronic kidney disease, or unspecified chronic kidney disease: Secondary | ICD-10-CM | POA: Diagnosis present

## 2013-07-24 DIAGNOSIS — R21 Rash and other nonspecific skin eruption: Secondary | ICD-10-CM

## 2013-07-24 HISTORY — DX: Presence of cardiac pacemaker: Z95.0

## 2013-07-24 HISTORY — DX: Other ill-defined heart diseases: I51.89

## 2013-07-24 LAB — TROPONIN I: Troponin I: <0.3 ng/mL (ref <0.30)

## 2013-07-24 LAB — POCT I-STAT, CHEM 8
BUN: 10 mg/dL (ref 6–23)
CALCIUM ION: 1.29 mmol/L (ref 1.13–1.30)
CHLORIDE: 103 meq/L (ref 96–112)
Creatinine, Ser: 1.2 mg/dL (ref 0.50–1.35)
GLUCOSE: 94 mg/dL (ref 70–99)
HEMATOCRIT: 42 % (ref 39.0–52.0)
HEMOGLOBIN: 14.3 g/dL (ref 13.0–17.0)
Potassium: 3.9 mEq/L (ref 3.7–5.3)
Sodium: 142 mEq/L (ref 137–147)
TCO2: 26 mmol/L (ref 0–100)

## 2013-07-24 LAB — CBC
HCT: 40.8 % (ref 39.0–52.0)
Hemoglobin: 13.7 g/dL (ref 13.0–17.0)
MCH: 30.4 pg (ref 26.0–34.0)
MCHC: 33.6 g/dL (ref 30.0–36.0)
MCV: 90.5 fL (ref 78.0–100.0)
PLATELETS: 203 10*3/uL (ref 150–400)
RBC: 4.51 MIL/uL (ref 4.22–5.81)
RDW: 14.1 % (ref 11.5–15.5)
WBC: 6.9 10*3/uL (ref 4.0–10.5)

## 2013-07-24 LAB — BASIC METABOLIC PANEL
BUN: 11 mg/dL (ref 6–23)
CALCIUM: 9.1 mg/dL (ref 8.4–10.5)
CHLORIDE: 105 meq/L (ref 96–112)
CO2: 25 meq/L (ref 19–32)
Creatinine, Ser: 1.1 mg/dL (ref 0.50–1.35)
GFR calc non Af Amer: 69 mL/min — ABNORMAL LOW (ref >90)
GFR, EST AFRICAN AMERICAN: 80 mL/min — AB (ref >90)
Glucose, Bld: 92 mg/dL (ref 70–99)
Potassium: 4.2 mEq/L (ref 3.7–5.3)
SODIUM: 141 meq/L (ref 137–147)

## 2013-07-24 LAB — POCT I-STAT TROPONIN I: TROPONIN I, POC: 0 ng/mL (ref 0.00–0.08)

## 2013-07-24 MED ORDER — NITROGLYCERIN 0.4 MG SL SUBL
0.4000 mg | SUBLINGUAL_TABLET | SUBLINGUAL | Status: DC | PRN
  Administered 2013-07-24 (×3): 0.4 mg via SUBLINGUAL
  Filled 2013-07-24: qty 25

## 2013-07-24 MED ORDER — ONDANSETRON HCL 4 MG/2ML IJ SOLN: 4.0000 mg | Freq: Four times a day (QID) | INTRAMUSCULAR | Status: DC | PRN

## 2013-07-24 MED ORDER — HEPARIN SODIUM (PORCINE) 5000 UNIT/ML IJ SOLN
5000.0000 [IU] | Freq: Three times a day (TID) | INTRAMUSCULAR | Status: DC
  Administered 2013-07-24 – 2013-07-25 (×2): 5000 [IU] via SUBCUTANEOUS
  Filled 2013-07-24 (×5): qty 1

## 2013-07-24 MED ORDER — SODIUM CHLORIDE 0.9 % IJ SOLN: 3.0000 mL | INTRAMUSCULAR | Status: DC | PRN

## 2013-07-24 MED ORDER — ACETAMINOPHEN 325 MG PO TABS: 650.0000 mg | ORAL_TABLET | Freq: Four times a day (QID) | ORAL | Status: DC | PRN

## 2013-07-24 MED ORDER — ACETAMINOPHEN 650 MG RE SUPP: 650.0000 mg | Freq: Four times a day (QID) | RECTAL | Status: DC | PRN

## 2013-07-24 MED ORDER — ASPIRIN EC 81 MG PO TBEC
81.0000 mg | DELAYED_RELEASE_TABLET | Freq: Every day | ORAL | Status: DC
  Filled 2013-07-24: qty 1

## 2013-07-24 MED ORDER — SODIUM CHLORIDE 0.9 % IJ SOLN: 3.0000 mL | Freq: Two times a day (BID) | INTRAMUSCULAR | Status: DC

## 2013-07-24 MED ORDER — ONDANSETRON HCL 4 MG PO TABS: 4.0000 mg | ORAL_TABLET | Freq: Four times a day (QID) | ORAL | Status: DC | PRN

## 2013-07-24 MED ORDER — ADULT MULTIVITAMIN W/MINERALS CH
1.0000 | ORAL_TABLET | Freq: Every day | ORAL | Status: DC
Start: 2013-07-24 — End: 2013-07-26
  Administered 2013-07-24 – 2013-07-26 (×3): 1 via ORAL
  Filled 2013-07-24 (×3): qty 1

## 2013-07-24 MED ORDER — HYDROCODONE-ACETAMINOPHEN 5-325 MG PO TABS: 1.0000 | ORAL_TABLET | ORAL | Status: DC | PRN

## 2013-07-24 MED ORDER — ASPIRIN 81 MG PO CHEW
81.0000 mg | CHEWABLE_TABLET | ORAL | Status: AC
  Administered 2013-07-25: 81 mg via ORAL
  Filled 2013-07-24: qty 1

## 2013-07-24 MED ORDER — SODIUM CHLORIDE 0.9 % IV SOLN: 250.0000 mL | INTRAVENOUS | Status: DC | PRN

## 2013-07-24 MED ORDER — KETOROLAC TROMETHAMINE 30 MG/ML IJ SOLN
30.0000 mg | Freq: Once | INTRAMUSCULAR | Status: AC
  Administered 2013-07-24: 30 mg via INTRAVENOUS
  Filled 2013-07-24: qty 1

## 2013-07-24 MED ORDER — HYDROCERIN EX CREA
TOPICAL_CREAM | Freq: Two times a day (BID) | CUTANEOUS | Status: DC
  Administered 2013-07-24 – 2013-07-26 (×4): via TOPICAL
  Filled 2013-07-24: qty 113

## 2013-07-24 MED ORDER — NITROGLYCERIN IN D5W 200-5 MCG/ML-% IV SOLN
10.0000 ug/min | INTRAVENOUS | Status: DC
  Administered 2013-07-24: 10 ug/min via INTRAVENOUS
  Filled 2013-07-24: qty 250

## 2013-07-24 MED ORDER — ASPIRIN EC 81 MG PO TBEC
81.0000 mg | DELAYED_RELEASE_TABLET | Freq: Every day | ORAL | Status: DC
  Administered 2013-07-26: 09:00:00 81 mg via ORAL
  Filled 2013-07-24: qty 1

## 2013-07-24 MED ORDER — ASPIRIN EC 81 MG PO TBEC
81.0000 mg | DELAYED_RELEASE_TABLET | Freq: Once | ORAL | Status: AC
  Administered 2013-07-24: 81 mg via ORAL
  Filled 2013-07-24: qty 1

## 2013-07-24 MED ORDER — MORPHINE SULFATE 4 MG/ML IJ SOLN
4.0000 mg | Freq: Once | INTRAMUSCULAR | Status: AC
  Administered 2013-07-24: 4 mg via INTRAVENOUS
  Filled 2013-07-24: qty 1

## 2013-07-24 NOTE — ED Notes (Signed)
Paged IV team 

## 2013-07-24 NOTE — ED Notes (Signed)
MD at bedside. 

## 2013-07-24 NOTE — ED Notes (Signed)
Spoke with IV team will attempt IV.  

## 2013-07-24 NOTE — ED Notes (Signed)
Chest pain pressure currently 6/10. Resting comfortably on stretcher.

## 2013-07-24 NOTE — ED Notes (Signed)
IV team at bedside 

## 2013-07-24 NOTE — ED Notes (Addendum)
Pt. States Chest pressure 8 out of 10 with change in LOC witnessed by a friend around 09:30. Meds 1 SL Nitro and 1 ASA. Medtronic PPM placed 2012 on left chest.

## 2013-07-24 NOTE — ED Notes (Signed)
Cardiologist at bedside.  

## 2013-07-24 NOTE — Consult Note (Signed)
CARDIOLOGY CONSULT NOTE       Patient ID: Jacob Pineda MRN: [id number] DOB/AGE: 65-May-1952 65 y.o.  Admit date: 07/24/2013 Referring Physician:  Josem Kaufmann Primary Physician: Default, Provider, MD Primary Cardiologist:  [facility] Reason for Consultation:  Chest Pain and syncope  Active Problems:   Unstable angina   HPI:   63 yo with known CAD  Describes having stents placed to 2 different arteries at [facility] in 2012  Sees them for pacer f/u as well.  Has not had repeat stress testing or cath since.  Had gotten out of shower this am about 9:30 and "passed out" with no warning  Did not have dyspnea, palpitations or chest pain prior to passing out.  ? Out for 5 minutes.  Does not think he hurt himself but then started having chest pain.  Left side of chest radiating to axilla.  Not like pain in 2012.  In ER pain not very responsive to narcotics or nitro.  Initial ECG non acute and troponin negative despite saying he has 8/10 pain.  No calf pain.  Mild dyspnea No positional or pleuritic pain.  No history of DVT.  Currently looks comfortable but complains of 4/10 pain.  Telemetry with normal A pacing and ventricular morphology normal  ROS All other systems reviewed and negative except as noted above  Past Medical History  Diagnosis Date  . Coronary artery disease   . Pacemaker     History reviewed. No pertinent family history.  History   Social History  . Marital Status: Widowed    Spouse Name: N/A    Number of Children: N/A  . Years of Education: N/A   Occupational History  . Not on file.   Social History Main Topics  . Smoking status: Former Games developer  . Smokeless tobacco: Not on file  . Alcohol Use: No  . Drug Use: No  . Sexual Activity:    Other Topics Concern  . Not on file   Social History Narrative  . No narrative on file    Past Surgical History  Procedure Laterality Date  . Pacemaker insertion    . Cardiac stents times 2    . Cholecystectomy         .  nitroGLYCERIN 10 mcg/min (07/24/13 1852)    Physical Exam: Blood pressure 125/75, pulse 71, temperature 98.2 F (36.8 C), temperature source Oral, resp. rate 16, height 5\' 11"  (1.803 m), weight 175 lb (79.379 kg), SpO2 99.00%.   Affect appropriate Healthy:  appears stated age HEENT: normal Neck supple with no adenopathy JVP normal no bruits no thyromegaly Lungs clear with no wheezing and good diaphragmatic motion Heart:  S1/S2 no murmur, no rub, gallop or click PMI normal Abdomen: benighn, BS positve, no tenderness, no AAA no bruit.  No HSM or HJR Distal pulses intact with no bruits No edema Neuro non-focal Skin warm and dry No muscular weakness   Labs:   Lab Results  Component Value Date   WBC 6.9 07/24/2013   HGB 14.3 07/24/2013   HCT 42.0 07/24/2013   MCV 90.5 07/24/2013   PLT 203 07/24/2013    Recent Labs Lab 07/24/13 1151 07/24/13 1223  NA 141 142  K 4.2 3.9  CL 105 103  CO2 25  --   BUN 11 10  CREATININE 1.10 1.20  CALCIUM 9.1  --   GLUCOSE 92 94   Lab Results  Component Value Date   TROPONINI <0.30 07/23/2012     Radiology: Dg  Chest Port 1 View  07/24/2013   CLINICAL DATA:  Chest pain, dizziness  EXAM: PORTABLE CHEST - 1 VIEW  COMPARISON:  11/26/2012  FINDINGS: The heart size and mediastinal contours are within normal limits. There is a dual lead cardiac pacer present. Both lungs are clear. The visualized skeletal structures are unremarkable.  IMPRESSION: No active disease.   Electronically Signed   By: Elige KoHetal  Patel   On: 07/24/2013 11:53    EKG:  A pacing normal ventricular complex   ASSESSMENT AND PLAN:  Syncope:  Does not sound cardiac  Pacer seems to be working normally but will have to interogate in am Not clear if it is Camera operatormedtronic or St Jude.  Need records from [facility].  Will arrange cath and echo for am given known CAD and pain now requiring iv nitro.  Would not start heparin unless enzymes turn positive.  ASA.   CAD:  No evidence of acute event.  ASA  Check  lipids should be on statin   Signed: Charlton Hawseter Ashira Kirsten 07/24/2013, 7:06 PM

## 2013-07-24 NOTE — ED Provider Notes (Signed)
CSN: [id number]     Arrival date & time 07/24/13  1058 History   First MD Initiated Contact with Patient 07/24/13 1152     Chief Complaint  Patient presents with  . Chest Pain   (Consider location/radiation/quality/duration/timing/severity/associated sxs/prior Treatment) Patient is a 63 y.o. male presenting with chest pain.  Chest Pain  Pt with history of CAD and pacemaker placement reports he was standing in his home earlier today when he had sudden onset of syncope, fell onto a couch, no head injury. Friend on scene told him he was unconscious for about . Pt states he woke up with moderate aching pain in L chest radiating to axilla. Not associated with SOB. Improved some with NTG and ASA taken at home but still 7/10 at the time of my eval.   Past Medical History  Diagnosis Date  . Coronary artery disease    Past Surgical History  Procedure Laterality Date  . Pacemaker insertion    . Cardiac stents times 2    . Cholecystectomy     History reviewed. No pertinent family history. History  Substance Use Topics  . Smoking status: Former Games developer  . Smokeless tobacco: Not on file  . Alcohol Use: No    Review of Systems  Cardiovascular: Positive for chest pain.   All other systems reviewed and are negative except as noted in HPI.   Allergies  Review of patient's allergies indicates no known allergies.  Home Medications   Current Outpatient Rx  Name  Route  Sig  Dispense  Refill  . acetaminophen (TYLENOL) 500 MG tablet   Oral   Take 1,000 mg by mouth daily as needed for pain.         Marland Kitchen aspirin EC 81 MG tablet   Oral   Take 81 mg by mouth daily.         . Multiple Vitamin (MULTIVITAMIN WITH MINERALS) TABS   Oral   Take 1 tablet by mouth daily.          BP 112/72  Pulse 71  Temp(Src) 98.1 F (36.7 C) (Oral)  Resp 18  Ht 5\' 11"  (1.803 m)  Wt 175 lb (79.379 kg)  BMI 24.42 kg/m2  SpO2 99% Physical Exam  Nursing note and vitals reviewed. Constitutional: He  is oriented to person, place, and time. He appears well-developed and well-nourished.  HENT:  Head: Normocephalic and atraumatic.  Eyes: EOM are normal. Pupils are equal, round, and reactive to light.  Neck: Normal range of motion. Neck supple.  Cardiovascular: Normal rate, normal heart sounds and intact distal pulses.   Pulmonary/Chest: Effort normal and breath sounds normal.  Abdominal: Bowel sounds are normal. He exhibits no distension. There is no tenderness.  Musculoskeletal: Normal range of motion. He exhibits no edema and no tenderness.  Neurological: He is alert and oriented to person, place, and time. He has normal strength. No cranial nerve deficit or sensory deficit.  Skin: Skin is warm and dry. No rash noted.  Psychiatric: He has a normal mood and affect.    ED Course  Procedures (including critical care time) Labs Review Labs Reviewed  BASIC METABOLIC PANEL - Abnormal; Notable for the following:    GFR calc non Af Amer 69 (*)    GFR calc Af Amer 80 (*)    All other components within normal limits  CBC  POCT I-STAT TROPONIN I  POCT I-STAT, CHEM 8   Imaging Review Dg Chest Port 1 View  07/24/2013  CLINICAL DATA:  Chest pain, dizziness  EXAM: PORTABLE CHEST - 1 VIEW  COMPARISON:  11/26/2012  FINDINGS: The heart size and mediastinal contours are within normal limits. There is a dual lead cardiac pacer present. Both lungs are clear. The visualized skeletal structures are unremarkable.  IMPRESSION: No active disease.   Electronically Signed   By: Elige KoHetal  Patel   On: 07/24/2013 11:53    EKG Interpretation    Date/Time:  Thursday July 24 2013 11:01:56 EST Ventricular Rate:  85 PR Interval:  80 QRS Duration: 78 QT Interval:  352 QTC Calculation: 418 R Axis:   36 Text Interpretation:  Electronic atrial pacemaker No significant change since last tracing Confirmed by SHELDON  MD, CHARLES ([id number]) on 07/24/2013 12:29:54 PM            MDM   1. Chest pain   2. Syncope      Pain improved minimally with NTG, dilaudid. Will try Toradol. Admit for further eval.     Charles B. Bernette MayersSheldon, MD 07/24/13 [phone]

## 2013-07-24 NOTE — ED Notes (Signed)
Pt c/o CP with SOB and syncopal episode today; pt in no distress at present

## 2013-07-24 NOTE — H&P (Signed)
Date: 07/24/2013               Patient Name:  Jacob Pineda MRN: [id number]  DOB: [birth year] Age / Sex: 63 y.o., male   PCP: Provider Default, MD         Medical Service: Internal Medicine Teaching Service         Attending Physician: Dr. Rocco SereneLawrence D Klima, MD    First Contact: Dr. Andrey CampanileWilson Pager: [phone]  Second Contact: Dr. Dierdre SearlesLi Pager: [phone]       After Hours (After 5p/  First Contact Pager: [phone]  weekends / holidays): Second Contact Pager: [phone]   Chief Complaint: Passed out, Chest Pain  History of Present Illness: Jacob Pineda is a 63 year old while male with PMH of CAD (reports 2 stents placed in 2012), Symptomatic Bradycardia (PPM placed in 2012), and CKD Stage 2.  He reports he was in his normal state of health this morning when at around 9:30am he passed out and fell onto his couch.  He reports a friend was present and told him he was unconscious for about 4 minutes.  He denies any loss of bowel or bladder function, no tongue biting, no muscle jerking, no previous history of seizure activity.    He denies HA.  He denies any abdominal pain.  He has no history of hypoglycemia.  He denies any prodromal symptoms of dizziness, diaphoresis, nausea, palpitations, or chest pain.  He reports that after he regained consciousness he had substernal left sided chest pain that radiates to his left flank.  He reported that the pain was an 8/10 at worst, was not relieved by ASA or NTG by EMS.  He does report that currently the pain is a 4/10 after a Nitro drip was started.   Meds: Prescriptions prior to admission  Medication Sig Dispense Refill  . acetaminophen (TYLENOL) 500 MG tablet Take 1,000 mg by mouth daily as needed for pain.      Marland Kitchen. aspirin EC 81 MG tablet Take 81 mg by mouth daily.      . Multiple Vitamin (MULTIVITAMIN WITH MINERALS) TABS Take 1 tablet by mouth daily.        Allergies: Allergies as of 07/24/2013  . (No Known Allergies)   Past Medical History    Diagnosis Date  . Coronary artery disease   . Pacemaker    Past Surgical History  Procedure Laterality Date  . Pacemaker insertion    . Cardiac stents times 2    . Cholecystectomy     History reviewed. No pertinent family history. History   Social History  . Marital Status: Widowed    Spouse Name: N/A    Number of Children: N/A  . Years of Education: N/A   Occupational History  . Not on file.   Social History Main Topics  . Smoking status: Former Games developermoker  . Smokeless tobacco: Not on file  . Alcohol Use: No  . Drug Use: No  . Sexual Activity:    Other Topics Concern  . Not on file   Social History Narrative  . No narrative on file    Review of Systems: Review of Systems  Constitutional: Negative for fever, chills, weight loss, malaise/fatigue and diaphoresis.  HENT: Negative for congestion and sore throat.   Eyes: Negative for blurred vision and double vision.  Respiratory: Negative for cough and shortness of breath.   Cardiovascular: Positive for chest pain. Negative for palpitations, orthopnea, claudication and leg swelling.  Gastrointestinal:  Negative for heartburn, nausea, vomiting, abdominal pain, diarrhea, constipation and blood in stool.  Genitourinary: Negative for dysuria, urgency and frequency.  Musculoskeletal: Positive for falls.  Skin: Positive for rash (Left foot for last 3 days).  Neurological: Positive for loss of consciousness. Negative for dizziness, tremors, sensory change, speech change, focal weakness, seizures, weakness and headaches.     Physical Exam: Blood pressure 125/75, pulse 71, temperature 98.2 F (36.8 C), temperature source Oral, resp. rate 16, height 5\' 11"  (1.803 m), weight 175 lb (79.379 kg), SpO2 99.00%. Physical Exam  Nursing note and vitals reviewed. Constitutional: He is oriented to person, place, and time and well-developed, well-nourished, and in no distress.  HENT:  Head: Normocephalic and atraumatic.  Eyes:  Conjunctivae and EOM are normal.  Cardiovascular: Normal rate, regular rhythm and normal heart sounds.   No murmur heard. JVP present  Pulmonary/Chest: Effort normal. No respiratory distress. He has no wheezes. He has no rales.  Abdominal: Soft. Bowel sounds are normal. He exhibits no distension. There is no tenderness.  Musculoskeletal: He exhibits no edema.  Neurological: He is alert and oriented to person, place, and time.  Skin: Skin is warm and dry.  Psychiatric: Affect and judgment normal.     Lab results: Basic Metabolic Panel:  Recent Labs  16/10/96 1151 07/24/13 1223  NA 141 142  K 4.2 3.9  CL 105 103  CO2 25  --   GLUCOSE 92 94  BUN 11 10  CREATININE 1.10 1.20  CALCIUM 9.1  --    CBC:  Recent Labs  07/24/13 1151 07/24/13 1223  WBC 6.9  --   HGB 13.7 14.3  HCT 40.8 42.0  MCV 90.5  --   PLT 203  --    Cardiac Enzymes: ISTAT Trop: 0.00 at 1206 07/24/13   Imaging results:  Dg Chest Port 1 View  07/24/2013   CLINICAL DATA:  Chest pain, dizziness  EXAM: PORTABLE CHEST - 1 VIEW  COMPARISON:  11/26/2012  FINDINGS: The heart size and mediastinal contours are within normal limits. There is a dual lead cardiac pacer present. Both lungs are clear. The visualized skeletal structures are unremarkable.  IMPRESSION: No active disease.   Electronically Signed   By: Elige Ko   On: 07/24/2013 11:53    Other results: EKG: 07/24/13 11:01:56.  Atrial Paced, normal axis, No ST or T wave changes, unchanged from previous (11/26/12).  Assessment & Plan by Problem: Jacob Pineda is a 63 year old male with PMH of CAD, symptomatic bradycardia who was admitted for concern for unstable angina after a syncopal episode.    Unstable angina - Patient reports left side substernal chest pain radiating to flank.  Initial EKG and troponins negative, however pain not relieved by SL nitro and ASA.  - Admit to inpatient (stepdown with cardiac monitoring) - Nitro gtt - Follow CE if  positive will start heparin -AM EKG - Interrogate Medtronic pacemaker - Likely cardiac cath in AM. As patient is high risk given history of CAD and atypical chest pain. Although his TIMI risk score is 2. - Risk stratify with Lipid Panel, A1C, TSH. - Patient would likely benefit from addition of Statin medication on discharge or at hospital follow up. Will obtain liver function tests with AM labs so this can be started.    Syncope - DDx: Most likely Cardiovascular (patient has had 2 previous ED visits in past year for syncope, near syncope, had a PPM placed in 2012 after being diagnosed  with symptomatic bradycardia after syncopal episodes).  Neurocardiogenic (less likely given no history of prodromal symptoms), Orthostatic Hypotension (patient does report syncopal episode happened after standing, orthostatic VS not taken in ED given concern for UA), Neurologic (less likely given no seizure like activity, no focal weakness or slurred speech), Endocrine (inlikely hypoglycemic, patient normoglycemic in ED and no history of DM or hypoglycemic medication ingestion) -TTE - Interrogate PPM  Rash on Lower Extremities - Patient denies any new creams or lotions used on the area, no new detergents, he does work as a Tax inspector in [employer] but reports his Water quality scientist always cover the area.  - Possible eczema - Will start trial of Eucerin cream  Diet: Heart Healthy, NPO after midnight Code Status: Full DVT PPx: Heparin Sq Dispo: Disposition is deferred at this time, awaiting improvement of current medical problems. Anticipated discharge in approximately 2 day(s).  Patient needs to establish with a PCP.  The patient does have a current PCP (Provider Default, MD) and does need an [facility] hospital follow-up appointment after discharge.  The patient does not have transportation limitations that hinder transportation to clinic appointments.  Signed: Carlynn Purl, DO 07/24/2013, 8:37 PM

## 2013-07-24 NOTE — ED Notes (Signed)
Pt has PPM Medtronic placed 2012 for Bradycardia. No issues.

## 2013-07-24 NOTE — ED Notes (Signed)
Paged admit MD for status of admission.

## 2013-07-24 NOTE — ED Notes (Signed)
Andrey CampanileWilson, IM MD reports that they will be down to see patient next.

## 2013-07-24 NOTE — ED Notes (Signed)
I-stat 8 ordered per lab working on equipment.

## 2013-07-24 NOTE — ED Notes (Signed)
Pt. Stats admit MD at bedside and will admit. Not visually seen by RN

## 2013-07-25 ENCOUNTER — Encounter (HOSPITAL_COMMUNITY)
Admission: EM | Disposition: A | Discharge status: Home / Self Care | Payer: Self-pay | Source: Home / Self Care | Attending: Internal Medicine

## 2013-07-25 DIAGNOSIS — I200 Unstable angina: Secondary | ICD-10-CM

## 2013-07-25 DIAGNOSIS — I517 Cardiomegaly: Secondary | ICD-10-CM

## 2013-07-25 DIAGNOSIS — Z87898 Personal history of other specified conditions: Secondary | ICD-10-CM | POA: Diagnosis present

## 2013-07-25 DIAGNOSIS — R079 Chest pain, unspecified: Secondary | ICD-10-CM

## 2013-07-25 DIAGNOSIS — Z8679 Personal history of other diseases of the circulatory system: Secondary | ICD-10-CM

## 2013-07-25 DIAGNOSIS — I2510 Atherosclerotic heart disease of native coronary artery without angina pectoris: Secondary | ICD-10-CM | POA: Diagnosis present

## 2013-07-25 DIAGNOSIS — R55 Syncope and collapse: Secondary | ICD-10-CM | POA: Diagnosis present

## 2013-07-25 HISTORY — PX: LEFT HEART CATHETERIZATION WITH CORONARY ANGIOGRAM: SHX5451

## 2013-07-25 LAB — TSH: TSH: 1.621 u[IU]/mL (ref 0.350–4.500)

## 2013-07-25 LAB — CBC
HEMATOCRIT: 38.4 % — AB (ref 39.0–52.0)
HEMATOCRIT: 36.7 % — AB (ref 39.0–52.0)
Hemoglobin: 12.6 g/dL — ABNORMAL LOW (ref 13.0–17.0)
Hemoglobin: 12.3 g/dL — ABNORMAL LOW (ref 13.0–17.0)
MCH: 30 pg (ref 26.0–34.0)
MCH: 30.2 pg (ref 26.0–34.0)
MCHC: 32.8 g/dL (ref 30.0–36.0)
MCHC: 33.5 g/dL (ref 30.0–36.0)
MCV: 91.4 fL (ref 78.0–100.0)
MCV: 90.2 fL (ref 78.0–100.0)
PLATELETS: 169 10*3/uL (ref 150–400)
Platelets: 181 10*3/uL (ref 150–400)
RBC: 4.2 MIL/uL — ABNORMAL LOW (ref 4.22–5.81)
RBC: 4.07 MIL/uL — ABNORMAL LOW (ref 4.22–5.81)
RDW: 14.3 % (ref 11.5–15.5)
RDW: 14.2 % (ref 11.5–15.5)
WBC: 5.8 10*3/uL (ref 4.0–10.5)
WBC: 5.8 10*3/uL (ref 4.0–10.5)

## 2013-07-25 LAB — PROTIME-INR
INR: 0.94 (ref 0.00–1.49)
PROTHROMBIN TIME: 12.4 s (ref 11.6–15.2)

## 2013-07-25 LAB — TROPONIN I
Troponin I: <0.3 ng/mL (ref <0.30)
Troponin I: <0.3 ng/mL (ref <0.30)

## 2013-07-25 LAB — CREATININE, SERUM
Creatinine, Ser: 1.03 mg/dL (ref 0.50–1.35)
GFR, EST AFRICAN AMERICAN: 87 mL/min — AB (ref >90)
GFR, EST NON AFRICAN AMERICAN: 75 mL/min — AB (ref >90)

## 2013-07-25 LAB — COMPREHENSIVE METABOLIC PANEL
ALK PHOS: 56 U/L (ref 39–117)
ALT: 9 U/L (ref 0–53)
AST: 14 U/L (ref 0–37)
Albumin: 3.2 g/dL — ABNORMAL LOW (ref 3.5–5.2)
BUN: 16 mg/dL (ref 6–23)
CO2: 27 meq/L (ref 19–32)
Calcium: 8.8 mg/dL (ref 8.4–10.5)
Chloride: 104 mEq/L (ref 96–112)
Creatinine, Ser: 1.07 mg/dL (ref 0.50–1.35)
GFR calc Af Amer: 83 mL/min — ABNORMAL LOW (ref >90)
GFR, EST NON AFRICAN AMERICAN: 71 mL/min — AB (ref >90)
GLUCOSE: 95 mg/dL (ref 70–99)
Potassium: 4.1 mEq/L (ref 3.7–5.3)
Sodium: 140 mEq/L (ref 137–147)
TOTAL PROTEIN: 6.2 g/dL (ref 6.0–8.3)
Total Bilirubin: 0.4 mg/dL (ref 0.3–1.2)

## 2013-07-25 LAB — MRSA PCR SCREENING: MRSA BY PCR: NEGATIVE

## 2013-07-25 LAB — LIPID PANEL
CHOL/HDL RATIO: 3.5 ratio
Cholesterol: 158 mg/dL (ref 0–200)
HDL: 45 mg/dL (ref >39)
LDL CALC: 99 mg/dL (ref 0–99)
Triglycerides: 70 mg/dL (ref <150)
VLDL: 14 mg/dL (ref 0–40)

## 2013-07-25 LAB — RAPID URINE DRUG SCREEN, HOSP PERFORMED
Amphetamines: NOT DETECTED
BARBITURATES: NOT DETECTED
Benzodiazepines: NOT DETECTED
COCAINE: NOT DETECTED
Opiates: POSITIVE — AB
Tetrahydrocannabinol: NOT DETECTED

## 2013-07-25 LAB — HEMOGLOBIN A1C
Hgb A1c MFr Bld: 5.7 % — ABNORMAL HIGH (ref <5.7)
MEAN PLASMA GLUCOSE: 117 mg/dL — AB (ref <117)

## 2013-07-25 SURGERY — LEFT HEART CATHETERIZATION WITH CORONARY ANGIOGRAM
Anesthesia: LOCAL

## 2013-07-25 MED ORDER — ENOXAPARIN SODIUM 40 MG/0.4ML ~~LOC~~ SOLN
40.0000 mg | SUBCUTANEOUS | Status: DC
  Filled 2013-07-25: qty 0.4

## 2013-07-25 MED ORDER — ENOXAPARIN SODIUM 40 MG/0.4ML ~~LOC~~ SOLN
40.0000 mg | SUBCUTANEOUS | Status: DC
  Filled 2013-07-25 (×2): qty 0.4

## 2013-07-25 MED ORDER — HEPARIN SODIUM (PORCINE) 1000 UNIT/ML IJ SOLN
INTRAMUSCULAR | Status: AC
  Filled 2013-07-25: qty 1

## 2013-07-25 MED ORDER — SODIUM CHLORIDE 0.9 % IV SOLN: 1.0000 mL/kg/h | INTRAVENOUS | Status: AC

## 2013-07-25 MED ORDER — NITROGLYCERIN 0.2 MG/ML ON CALL CATH LAB
INTRAVENOUS | Status: AC
  Filled 2013-07-25: qty 1

## 2013-07-25 MED ORDER — HEPARIN (PORCINE) IN NACL 2-0.9 UNIT/ML-% IJ SOLN
INTRAMUSCULAR | Status: AC
  Filled 2013-07-25: qty 1500

## 2013-07-25 MED ORDER — LIDOCAINE HCL (PF) 1 % IJ SOLN
INTRAMUSCULAR | Status: AC
  Filled 2013-07-25: qty 30

## 2013-07-25 MED ORDER — MIDAZOLAM HCL 2 MG/2ML IJ SOLN
INTRAMUSCULAR | Status: AC
  Filled 2013-07-25: qty 2

## 2013-07-25 MED ORDER — FENTANYL CITRATE 0.05 MG/ML IJ SOLN
INTRAMUSCULAR | Status: AC
  Filled 2013-07-25: qty 2

## 2013-07-25 MED ORDER — METOPROLOL TARTRATE 12.5 MG HALF TABLET
12.5000 mg | ORAL_TABLET | Freq: Two times a day (BID) | ORAL | Status: DC
  Administered 2013-07-25 – 2013-07-26 (×2): 12.5 mg via ORAL
  Filled 2013-07-25 (×4): qty 1

## 2013-07-25 MED ORDER — ATORVASTATIN CALCIUM 80 MG PO TABS
80.0000 mg | ORAL_TABLET | Freq: Every day | ORAL | Status: DC
  Filled 2013-07-25 (×2): qty 1

## 2013-07-25 MED ORDER — VERAPAMIL HCL 2.5 MG/ML IV SOLN
INTRAVENOUS | Status: AC
  Filled 2013-07-25: qty 2

## 2013-07-25 NOTE — Progress Notes (Signed)
Patient ID: Jacob Pineda, male   DOB: [birth year], 63 y.o.   MRN: [id number]    Subjective:  Still with chest pains    Objective:  Filed Vitals:   07/25/13 0600 07/25/13 0700 07/25/13 0800 07/25/13 0829  BP: 114/65 108/59 112/67   Pulse:      Temp:    98.5 F (36.9 C)  TempSrc:    Oral  Resp: 15 17 13    Height:      Weight:      SpO2:        Intake/Output from previous day:  Intake/Output Summary (Last 24 hours) at 07/25/13 0915 Last data filed at 07/25/13 0800  Gross per 24 hour  Intake  406.4 ml  Output    450 ml  Net  -43.6 ml    Physical Exam: Affect appropriate Healthy:  appears stated age HEENT: normal Neck supple with no adenopathy Pacer under left clavicle  JVP normal no bruits no thyromegaly Lungs clear with no wheezing and good diaphragmatic motion Heart:  S1/S2 no murmur, no rub, gallop or click PMI normal Abdomen: benighn, BS positve, no tenderness, no AAA no bruit.  No HSM or HJR Distal pulses intact with no bruits No edema Neuro non-focal Skin warm and dry No muscular weakness   Lab Results: Basic Metabolic Panel:  Recent Labs  40/98/11 1151 07/24/13 1223 07/25/13 0230  NA 141 142 140  K 4.2 3.9 4.1  CL 105 103 104  CO2 25  --  27  GLUCOSE 92 94 95  BUN 11 10 16   CREATININE 1.10 1.20 1.07  CALCIUM 9.1  --  8.8   Liver Function Tests:  Recent Labs  07/25/13 0230  AST 14  ALT 9  ALKPHOS 56  BILITOT 0.4  PROT 6.2  ALBUMIN 3.2*   CBC:  Recent Labs  07/24/13 1151 07/24/13 1223 07/25/13 0230  WBC 6.9  --  5.8  HGB 13.7 14.3 12.6*  HCT 40.8 42.0 38.4*  MCV 90.5  --  91.4  PLT 203  --  181   Cardiac Enzymes:  Recent Labs  07/24/13 2148 07/25/13 0230  TROPONINI <0.30 <0.30   Fasting Lipid Panel:  Recent Labs  07/25/13 0230  CHOL 158  HDL 45  LDLCALC 99  TRIG 70  CHOLHDL 3.5    Imaging: Dg Chest Port 1 View  07/24/2013   CLINICAL DATA:  Chest pain, dizziness  EXAM: PORTABLE CHEST - 1 VIEW   COMPARISON:  11/26/2012  FINDINGS: The heart size and mediastinal contours are within normal limits. There is a dual lead cardiac pacer present. Both lungs are clear. The visualized skeletal structures are unremarkable.  IMPRESSION: No active disease.   Electronically Signed   By: Elige Ko   On: 07/24/2013 11:53    Cardiac Studies:  ECG:  A pacing with normal ventricular complex    Telemetry:  NSR no arrhythmia or long pauses   Echo:  Pending   Medications:   . [START ON 07/26/2013] aspirin EC  81 mg Oral Daily  . heparin  5,000 Units Subcutaneous Q8H  . hydrocerin   Topical BID  . multivitamin with minerals  1 tablet Oral Daily  . sodium chloride  3 mL Intravenous Q12H     . nitroGLYCERIN 10 mcg/min (07/24/13 2100)    Assessment/Plan:  Syncope:  ? Vagal after taking shower.  No evidence of acute coronary syndrome  Have asked EP to interrogate pacer Cannot tell from CXR what type of  device it is but had normal A pacing on admission and do not suspect any problems Leads look ok on CXR CAD:  Atypical pain but history of stents Cath today Continue ASA and heparin.     Charlton Hawseter Gordan Grell 07/25/2013, 9:15 AM

## 2013-07-25 NOTE — Progress Notes (Signed)
  Echocardiogram 2D Echocardiogram has been performed.  Jacob Pineda, Jacob Pineda 07/25/2013, 2:56 PM

## 2013-07-25 NOTE — H&P (Signed)
Internal Medicine Attending Admission Note Date: 07/25/2013  Patient name: Jacob Pineda Medical record number: [id number] Date of birth: 06/11/1951 Age: 63 y.o. Gender: male  I saw and evaluated the patient. I reviewed the resident's note and I agree with the resident's findings and plan as documented in the resident's note.  [name] is a 63 year old man with a past medical history of coronary artery disease status post 2 stent placements in 2012 and symptomatic bradycardia requiring pacemaker placement in 2012 who presents with syncope. He was in his usual state of health until the morning of admission when he was standing in his living room and calmly talking to his friend. He had been standing for a while. All of a sudden he lost consciousness and fell back onto the couch. The episode was witnessed by a friend and there was no signs or symptoms consistent with seizure activity. When he awoke approximately 4 minutes later he did noticed left-sided chest pain. He therefore presented to the emergency department for further evaluation and was admitted to the internal medicine teaching service. Of note in the 10 seconds prior to his syncopal event he specifically.denied any shortness of breath, chest pain, palpitations, dizziness, headaches, or vertigo.  Since admission he's been monitored on telemetry without any other significant events. His pacemaker will be interrogated; the results are not currently available. He ruled out for myocardial infarction with serial cardiac enzymes. Cardiology assessed the patient and felt that a catheterization was most appropriate. This was just completed and demonstrated widely patent stents with no significant stenoses elsewhere. His left ventricular ejection fraction was estimated at 50-55% and there was no significant mitral regurgitation.  Given that he has ruled out for myocardial infarction, has a catheterization without any culprit lesions to intervene  on, and has normal left ventricular ejection fraction, it is unclear what the cause of his chest pain or syncope was. If he had significant ischemia or left ventricular dysfunction a ventricular arrhythmia would seem likely. Given these were not found we remain at a loss for the cause of this event. Nonetheless, with a history of coronary artery disease, he needs to be on an aspirin a day as well as a statin and beta blocker. Prior to admission he was not on a beta blocker or statin. We will therefore start both of these medications and have him followup with his primary care provider. Once cleared by cardiology post catheterization I feel he is stable for discharge home.

## 2013-07-25 NOTE — H&P (View-Only) (Signed)
Patient ID: Jacob Pineda, male   DOB: [birth year], 63 y.o.   MRN: [id number]    Subjective:  Still with chest pains    Objective:  Filed Vitals:   07/25/13 0600 07/25/13 0700 07/25/13 0800 07/25/13 0829  BP: 114/65 108/59 112/67   Pulse:      Temp:    98.5 F (36.9 C)  TempSrc:    Oral  Resp: 15 17 13    Height:      Weight:      SpO2:        Intake/Output from previous day:  Intake/Output Summary (Last 24 hours) at 07/25/13 0915 Last data filed at 07/25/13 0800  Gross per 24 hour  Intake  406.4 ml  Output    450 ml  Net  -43.6 ml    Physical Exam: Affect appropriate Healthy:  appears stated age HEENT: normal Neck supple with no adenopathy Pacer under left clavicle  JVP normal no bruits no thyromegaly Lungs clear with no wheezing and good diaphragmatic motion Heart:  S1/S2 no murmur, no rub, gallop or click PMI normal Abdomen: benighn, BS positve, no tenderness, no AAA no bruit.  No HSM or HJR Distal pulses intact with no bruits No edema Neuro non-focal Skin warm and dry No muscular weakness   Lab Results: Basic Metabolic Panel:  Recent Labs  40/98/11 1151 07/24/13 1223 07/25/13 0230  NA 141 142 140  K 4.2 3.9 4.1  CL 105 103 104  CO2 25  --  27  GLUCOSE 92 94 95  BUN 11 10 16   CREATININE 1.10 1.20 1.07  CALCIUM 9.1  --  8.8   Liver Function Tests:  Recent Labs  07/25/13 0230  AST 14  ALT 9  ALKPHOS 56  BILITOT 0.4  PROT 6.2  ALBUMIN 3.2*   CBC:  Recent Labs  07/24/13 1151 07/24/13 1223 07/25/13 0230  WBC 6.9  --  5.8  HGB 13.7 14.3 12.6*  HCT 40.8 42.0 38.4*  MCV 90.5  --  91.4  PLT 203  --  181   Cardiac Enzymes:  Recent Labs  07/24/13 2148 07/25/13 0230  TROPONINI <0.30 <0.30   Fasting Lipid Panel:  Recent Labs  07/25/13 0230  CHOL 158  HDL 45  LDLCALC 99  TRIG 70  CHOLHDL 3.5    Imaging: Dg Chest Port 1 View  07/24/2013   CLINICAL DATA:  Chest pain, dizziness  EXAM: PORTABLE CHEST - 1 VIEW   COMPARISON:  11/26/2012  FINDINGS: The heart size and mediastinal contours are within normal limits. There is a dual lead cardiac pacer present. Both lungs are clear. The visualized skeletal structures are unremarkable.  IMPRESSION: No active disease.   Electronically Signed   By: Elige Ko   On: 07/24/2013 11:53    Cardiac Studies:  ECG:  A pacing with normal ventricular complex    Telemetry:  NSR no arrhythmia or long pauses   Echo:  Pending   Medications:   . [START ON 07/26/2013] aspirin EC  81 mg Oral Daily  . heparin  5,000 Units Subcutaneous Q8H  . hydrocerin   Topical BID  . multivitamin with minerals  1 tablet Oral Daily  . sodium chloride  3 mL Intravenous Q12H     . nitroGLYCERIN 10 mcg/min (07/24/13 2100)    Assessment/Plan:  Syncope:  ? Vagal after taking shower.  No evidence of acute coronary syndrome  Have asked EP to interrogate pacer Cannot tell from CXR what type of  device it is but had normal A pacing on admission and do not suspect any problems Leads look ok on CXR CAD:  Atypical pain but history of stents Cath today Continue ASA and heparin.     Charlton Hawseter Nishan 07/25/2013, 9:15 AM

## 2013-07-25 NOTE — Interval H&P Note (Signed)
History and Physical Interval Note:  07/25/2013 10:07 AM  Shontez Bernette RedbirdW Deziel  has presented today for surgery, with the diagnosis of cp  The various methods of treatment have been discussed with the patient and family. After consideration of risks, benefits and other options for treatment, the patient has consented to  Procedure(s): LEFT HEART CATHETERIZATION WITH CORONARY ANGIOGRAM (N/A) as a surgical intervention .  The patient's history has been reviewed, patient examined, no change in status, stable for surgery.  I have reviewed the patient's chart and labs.  Questions were answered to the patient's satisfaction.    Cath Lab Visit (complete for each Cath Lab visit)  Clinical Evaluation Leading to the Procedure:   ACS: no  Non-ACS:    Anginal Classification: CCS II  Anti-ischemic medical therapy: No Therapy  Non-Invasive Test Results: No non-invasive testing performed  Prior CABG: No previous CABG       Theron AristaPeter Oakland Mercy HospitalJordanMD,FACC 07/25/2013 10:07 AM

## 2013-07-25 NOTE — Progress Notes (Signed)
Subjective: [name] is doing well this morning, states he has felt "perfect" since 2-3am when his chest pain resolved.  He is prepared to go to catheterization this morning.   Objective: Vital signs in last 24 hours: Filed Vitals:   07/25/13 0700 07/25/13 0800 07/25/13 0829 07/25/13 1001  BP: 108/59 112/67    Pulse:    57  Temp:   98.5 F (36.9 C)   TempSrc:   Oral   Resp: 17 13    Height:      Weight:      SpO2:       Weight change:   Intake/Output Summary (Last 24 hours) at 07/25/13 1149 Last data filed at 07/25/13 0800  Gross per 24 hour  Intake  406.4 ml  Output    450 ml  Net  -43.6 ml   PEX General: alert, cooperative, NAD HEENT: NCAT, sclerae anicteric  Neck: supple, no lymphadenopathy Lungs: clear to ascultation bilaterally, normal work of respiration, no wheezes, rales, ronchi Heart: regular rate and rhythm, no murmurs, gallops, or rubs Abdomen: soft, non-tender, non-distended, normal bowel sounds Extremities: 2+ DP/PT pulses bilaterally, no cyanosis, clubbing, or edema Neurologic: alert & oriented X3, cranial nerves II-XII intact, strength grossly intact, sensation intact to light touch  Lab Results: Basic Metabolic Panel:  Recent Labs Lab 07/24/13 1151 07/24/13 1223 07/25/13 0230  NA 141 142 140  K 4.2 3.9 4.1  CL 105 103 104  CO2 25  --  27  GLUCOSE 92 94 95  BUN 11 10 16   CREATININE 1.10 1.20 1.07  CALCIUM 9.1  --  8.8   Liver Function Tests:  Recent Labs Lab 07/25/13 0230  AST 14  ALT 9  ALKPHOS 56  BILITOT 0.4  PROT 6.2  ALBUMIN 3.2*   CBC:  Recent Labs Lab 07/24/13 1151 07/24/13 1223 07/25/13 0230  WBC 6.9  --  5.8  HGB 13.7 14.3 12.6*  HCT 40.8 42.0 38.4*  MCV 90.5  --  91.4  PLT 203  --  181   Cardiac Enzymes:  Recent Labs Lab 07/24/13 2148 07/25/13 0230  TROPONINI <0.30 <0.30   Fasting Lipid Panel:  Recent Labs Lab 07/25/13 0230  CHOL 158  HDL 45  LDLCALC 99  TRIG 70  CHOLHDL 3.5   Thyroid  Function Tests:  Recent Labs Lab 07/25/13 0230  TSH 1.621   Coagulation:  Recent Labs Lab 07/25/13 0649  LABPROT 12.4  INR 0.94   Urine Drug Screen: Drugs of Abuse     Component Value Date/Time   LABOPIA POSITIVE* 07/25/2013 0923   COCAINSCRNUR NONE DETECTED 07/25/2013 0923   LABBENZ NONE DETECTED 07/25/2013 0923   AMPHETMU NONE DETECTED 07/25/2013 0923   THCU NONE DETECTED 07/25/2013 0923   LABBARB NONE DETECTED 07/25/2013 [id number]     Micro Results: Recent Results (from the past 240 hour(s))  MRSA PCR SCREENING     Status: None   Collection Time    07/24/13  7:59 PM      Result Value Range Status   MRSA by PCR NEGATIVE  NEGATIVE Final   Comment:            The GeneXpert MRSA Assay (FDA     approved for NASAL specimens     only), is one component of a     comprehensive MRSA colonization     surveillance program. It is not     intended to diagnose MRSA     infection nor to guide  or     monitor treatment for     MRSA infections.   Studies/Results: Dg Chest Port 1 View  07/24/2013   CLINICAL DATA:  Chest pain, dizziness  EXAM: PORTABLE CHEST - 1 VIEW  COMPARISON:  11/26/2012  FINDINGS: The heart size and mediastinal contours are within normal limits. There is a dual lead cardiac pacer present. Both lungs are clear. The visualized skeletal structures are unremarkable.  IMPRESSION: No active disease.   Electronically Signed   By: Elige Ko   On: 07/24/2013 11:53   Medications: I have reviewed the patient's current medications. Scheduled Meds: . [START ON 07/26/2013] aspirin EC  81 mg Oral Daily  . atorvastatin  80 mg Oral q1800  . enoxaparin (LOVENOX) injection  40 mg Subcutaneous Q24H  . hydrocerin   Topical BID  . metoprolol tartrate  12.5 mg Oral BID  . multivitamin with minerals  1 tablet Oral Daily  . sodium chloride  3 mL Intravenous Q12H   Continuous Infusions: . nitroGLYCERIN 10 mcg/min (07/24/13 2100)   PRN Meds:.sodium chloride, acetaminophen, acetaminophen,  HYDROcodone-acetaminophen, nitroGLYCERIN, ondansetron (ZOFRAN) IV, ondansetron, sodium chloride Assessment/Plan: Unstable angina- Patient reports left-sided substernal chest pain radiating to flank, not relieved by NTG sl and ASA. TIMI score of 2. EKG negative, troponins x 2 negative.  [employer] pacemaker interrogation complete. Risk stratification: A1C 5.7%, LDL 99, TSH within normal limits, LFTs within normal limits.  - cardiology consult, appreciate recs; catheterization today - continue Nitro gtt  - start metoprolol 12. 5 mg PO BID - start Lipitor 80 mg daily  Syncope- Concern for cardiac etiology given story (pacer placed in 2012 after being diagnosed with symptomatic bradycardia after syncopal episodes.  Other possiblities include neurocardiogenic (less likely given no history of prodromal symptoms), orthostatic hypotension (patient does report syncopal episode happened after standing, orthostatic vitals not taken in ED given concern for UA), neurologic (less likely given no seizure like activity, no focal weakness or slurred speech), endocrine (unlikely hypoglycemic, patient normoglycemic in ED and no history of DM or hypoglycemic medication ingestion). Echo showed EF 60-65%, normal systolic function, normal wall motion, grade 1 diastolic dysfunction, mild LVH.    Dispo: Disposition is deferred at this time, awaiting improvement of current medical problems.  Anticipated discharge in approximately 1 day(s).   The patient does not have a current PCP (Provider Default, MD) and does need an [facility] follow-up appointment after discharge.   .Services Needed at time of discharge: Y = Yes, Blank = No PT:   OT:   RN:   Equipment:   Other:     LOS: 1 day   Rocco Serene, MD 07/25/2013, 11:49 AM

## 2013-07-25 NOTE — CV Procedure (Signed)
    Cardiac Catheterization Procedure Note  Name: Jacob SchoolsDarrell W Pineda MRN: [id number] DOB: [birth year]  Procedure: Left Heart Cath, Selective Coronary Angiography, LV angiography  Indication: [age] yo WM with history of CAD s/p stents, presents with syncope and atypical chest pain.   Procedural Details: The right wrist was prepped, draped, and anesthetized with 1% lidocaine. Using the modified Seldinger technique, a 5 French sheath was introduced into the right radial artery. 3 mg of verapamil was administered through the sheath, weight-based unfractionated heparin was administered intravenously. Standard Judkins catheters were used for selective coronary angiography and left ventriculography. Catheter exchanges were performed over an exchange length guidewire. There were no immediate procedural complications. A TR band was used for radial hemostasis at the completion of the procedure.  The patient was transferred to the post catheterization recovery area for further monitoring.  Procedural Findings: Hemodynamics: AO 132/77 mean 101 mm Hg LV 131/17 mm Hg  Coronary angiography: Coronary dominance: right  Left mainstem: Normal  Left anterior descending (LAD): Stent noted in proximal to mid LAD. Stents are widely patent with diffuse 20% disease. The first diagonal has mild disease proximally to 20%.   Left circumflex (LCx): There is a single large OM. It has a stent proximally that is widely patent.   Right coronary artery (RCA): mild calcification with 20-30% disease proximally.  Left ventriculography: Left ventricular systolic function is normal, LVEF is estimated at 50-55%, there is no significant mitral regurgitation   Final Conclusions:   1. No obstructive CAD. Stents in the LAD and LCx are patent. 2. Good LV function.  Recommendations: Continue medical Rx.  Theron Aristaeter St. Alex[facility] Hospital [facility] Jefferson CampusJordanMD,FACC 07/25/2013, 10:31 AM

## 2013-07-25 NOTE — Progress Notes (Signed)
TR BAND REMOVAL  LOCATION:  right radial  DEFLATED PER PROTOCOL:  yes  TIME BAND OFF / DRESSING APPLIED:   1300   SITE UPON ARRIVAL:   Level 0  SITE AFTER BAND REMOVAL:  Level 0  REVERSE ALLEN'S TEST:    positive  CIRCULATION SENSATION AND MOVEMENT:  Within Normal Limits  yes  COMMENTS:    

## 2013-07-25 NOTE — Progress Notes (Signed)
Utilization Review Completed.Jacob Pineda T1/08/2013  

## 2013-07-26 ENCOUNTER — Encounter (HOSPITAL_COMMUNITY): Payer: Self-pay | Admitting: Internal Medicine

## 2013-07-26 DIAGNOSIS — R42 Dizziness and giddiness: Secondary | ICD-10-CM | POA: Diagnosis not present

## 2013-07-26 DIAGNOSIS — I2510 Atherosclerotic heart disease of native coronary artery without angina pectoris: Secondary | ICD-10-CM

## 2013-07-26 DIAGNOSIS — I498 Other specified cardiac arrhythmias: Secondary | ICD-10-CM

## 2013-07-26 DIAGNOSIS — Z950 Presence of cardiac pacemaker: Secondary | ICD-10-CM

## 2013-07-26 LAB — GLUCOSE, CAPILLARY: Glucose-Capillary: 122 mg/dL — ABNORMAL HIGH (ref 70–99)

## 2013-07-26 MED ORDER — ATORVASTATIN CALCIUM 80 MG PO TABS: 80.0000 mg | ORAL_TABLET | Freq: Every day | ORAL | Status: DC

## 2013-07-26 MED ORDER — METOPROLOL TARTRATE 12.5 MG HALF TABLET: 12.5000 mg | ORAL_TABLET | Freq: Two times a day (BID) | ORAL | Status: DC

## 2013-07-26 NOTE — Progress Notes (Signed)
Patient up and ambulating hall took a shower tolerated well. He denies any complications and wants to go home. Social worker arranging for cab ride home.

## 2013-07-26 NOTE — Progress Notes (Addendum)
CSW met with patient. Patient is alert and oriented X3. Patient has called all his friends but he is the only one who drives and he is not allowed to drive for 48 hours due to his procedure. Patient was very appreciative and offered to come in on Tuesday and repay social work department. Patient needs transportation to [address] [place], [place]. Part buses do not run on Saturday, only city buses. CSW called CSW Surveyor, quantity, Nathaniel Man, He approved for CSW to call cab. CSW called [employer] and set up cab.  Heer Justiss C. Blue MSW, Daniel

## 2013-07-26 NOTE — Discharge Summary (Signed)
Name: Jacob Pineda MRN: [id number] DOB: [birth year] 63 y.o. PCP: Provider Default, MD  Date of Admission: 07/24/2013 11:07 AM Date of Discharge: 07/26/2013 Attending Physician: Dr. Josem Kaufmann  Discharge Diagnosis: 1. Chest pain (resolved)  2. Syncope, resolved.  3. Dizziness, resolved.  4. History of bradycardia status post pacemaker   Discharge Medications:   Medication List         acetaminophen 500 MG tablet  Commonly known as:  TYLENOL  Take 1,000 mg by mouth daily as needed for pain.     aspirin EC 81 MG tablet  Take 81 mg by mouth daily.     atorvastatin 80 MG tablet  Commonly known as:  LIPITOR  Take 1 tablet (80 mg total) by mouth daily at 6 PM.     metoprolol tartrate 12.5 mg Tabs tablet  Commonly known as:  LOPRESSOR  Take 0.5 tablets (12.5 mg total) by mouth 2 (two) times daily.     multivitamin with minerals Tabs tablet  Take 1 tablet by mouth daily.        Disposition and follow-up:   Mr.Jacob Pineda was discharged from [facility] in stable condition.  At the hospital follow up visit please address:  1.Medication compliance, chronic medical conditions, dizziness symptoms if still present  2.  Labs / imaging needed at time of follow-up: none   3.  Pending labs/ test needing follow-up: none   Follow-up Appointments:   Discharge Instructions:     Discharge Orders   Future Orders Complete By Expires   Diet - low sodium heart healthy  As directed    Discharge instructions  As directed    Comments:     Please read all the information below:  Please pick up your medications from the [facility] on [address] and take as instructed   Please call [facility] or a Cardiologist in [place] [phone]) on 07/28/13 and schedule follow up soon    Please follow up with a primary care provider-See list below to establish      Emergency Department Resource Guide 1) Find a Doctor and Pay Out of Pocket Although you  won't have to find out who is covered by your insurance plan, it is a good idea to ask around and get recommendations. You will then need to call the office and see if the doctor you have chosen will accept you as a new patient and what types of options they offer for patients who are self-pay. Some doctors offer discounts or will set up payment plans for their patients who do not have insurance, but you will need to ask so you aren't surprised when you get to your appointment.  2) Contact Your Local Health Department Not all health departments have doctors that can see patients for sick visits, but many do, so it is worth a call to see if yours does. If you don't know where your local health department is, you can check in your phone book. The CDC also has a tool to help you locate your state's health department, and many state websites also have listings of all of their local health departments.  3) Find a Walk-in Clinic If your illness is not likely to be very severe or complicated, you may want to try a walk in clinic. These are popping up all over the country in pharmacies, drugstores, and shopping centers. They're usually staffed by nurse practitioners or physician assistants that have been trained to treat common illnesses and  complaints. They're usually fairly quick and inexpensive. However, if you have serious medical issues or chronic medical problems, these are probably not your best option.  No Primary Care Doctor: Call Health Connect at  [phone] - they can help you locate a primary care doctor that  accepts your insurance, provides certain services, etc. Physician Referral Service- [phone]  Chronic Pain Problems: Organization         Address  Phone   Notes  [facility]  [phone] Patients need to be referred by their primary care doctor.  Medication Assistance: Organization         Address  Phone   Notes  [facility] [address] [place], [place] [address] [phone] --Must be a resident of [facility] -- Must have NO insurance coverage whatsoever (no Medicaid/ Medicare, etc.) -- The pt. MUST have a primary care doctor that directs their care regularly and follows them in the community  MedAssist  [phone]  [employer]  [phone]   Agencies that provide inexpensive medical care: Organization         Address  Phone   Notes  [facility]  [phone]  [facility]    [phone]  [facility] [address] [place], [place] [address] [phone]  [facility] [address] [place] [place] [phone]  Planned Parenthood    [phone]  [facility]    [phone]  [facility]  [address], [place] Phone:  [phone], Fax:  [phone] Hours of Operation:  9 am - 6 pm, M-F.  Also accepts Medicaid/Medicare and self-pay. [facility]  [address], [place] Phone: [phone], Fax: [phone]. Hours of Operation:  8:30 am - 5:30 pm, M-F.  Also accepts Medicaid and self-pay. [facility] [place] [address] [place] Phone: [phone]  [facility] [address] Natasha Bence [place], [place] [phone], Ext. [phone] Mondays & Thursdays: 7-9 AM.  First 15 patients are seen on a first come, first serve basis.   Medicaid-accepting [facility] Providers:  Organization         Address  Phone   Notes  [facility] [address], [place] [phone] Also accepts self-pay patients. [facility] [address] Laurell Josephs [place], [place]  [phone]  [facility] [address], [place] [phone]  [facility] [address] [place] [phone]  Renaye Rakers [address], [place]   [phone] Only accepts [place] patients after they have their name applied to their card.  Self-Pay (no insurance) in [facility]:  Organization         Address  Phone   Notes  Sickle Cell Patients, [facility] [address] [place], [place] [phone]  [facility] [address] [place], [place] [phone]  [facility] [place]  [address], [place] [phone]  [facility]/Dr. Osei-Bonsu  [address] [place] or [address], [place] [phone] Phone number for both [place] and [place] locations is the same. [facility] [address], Gin[place] Otto [phone])  [phone]  [facility] [address], [place] or [address] [phone]  [facility] [address] [place], [place] [phone], phone; [phone], fax Sees patients 1st and 3rd Saturday of every month.  Must not qualify for public or private insurance (i.e. Medicaid, Medicare, [place] Health Choice, Veterans' Benefits) . Household income should be no more than 200% of the poverty level .The clinic cannot treat you if you are pregnant or think you are pregnant . Sexually transmitted diseases are not treated at the clinic.   Dental Care: Organization         Address  Phone  Notes  [facility] of [facility] [address] [place], [place] [phone] Accepts children up to age 31 who are enrolled in [place] or [place] Health Choice; pregnant women with a Medicaid card; and children who have applied for Medicaid or [place] Health Choice, but were declined, whose parents can pay a reduced fee at time of service. [facility]  [address], [place] [phone] Accepts children up to age 58 who  are enrolled in [place] or [place] Health Choice; pregnant women with a Medicaid card; and children who have applied for Medicaid or [place] Health Choice, but were declined, whose parents can pay a reduced fee at time of service. [facility]  [address] [place], [place] [phone] Patients are seen by appointment only. Walk-ins are not accepted. [facility] will see patients 82 years of age and older. Monday - Tuesday (8am-5pm) Most Wednesdays (8:30-5pm) $30 per visit, cash only [facility] Adult [facility] Access [facility]  [address], [facility] [phone] Patients are seen by appointment only. Walk-ins are not accepted. [facility] will see patients 51 years of age and older. One Wednesday Evening (Monthly: Volunteer Based).  $30 per visit, cash only [employer]  [phone] for adults; Children under age 73, call Graduate Pediatric Dentistry at [phone]. Children aged 80-14, please call [phone] to request a pediatric application.  Dental services are provided in all areas of dental care including fillings, crowns and bridges, complete and partial dentures, implants, gum treatment, root canals, and extractions. Preventive care is also provided. Treatment is provided to both adults and children. Patients are selected via a lottery and there is often a waiting list.  [facility] [address] [place]  [phone] [email or web address]  [facility] [address] [place], [place] [phone], Ext. [phone] Second and Fourth Thursday of each month, opens at 6:30 AM; Clinic ends at 9 AM.  Patients are seen on a first-come first-served basis, and a limited number are seen during each clinic.  [facility]  [address] Ether Griffins [place], [place] [phone]   Eligibility Requirements You must have lived in [place], [place], or [place] counties for at least the last three months.   You cannot  be eligible for state or federal sponsored [place], including [employer], [place], or [employer].   You generally cannot be eligible for healthcare insurance through your employer.     How to apply: Eligibility screenings are held every Tuesday and Wednesday afternoon from 1:00 pm until 4:00 pm. You do not need an appointment for the interview! [facility] [address] [place], [place] [phone]  [facility]  [phone]  [email or web address]  [facility]  [phone]  [facility]  [phone]   Behavioral Health Resources in the Community: Intensive Outpatient Programs Organization         Address  Phone  Notes  [facility] [address], [place], [place] [phone]  [facility] [address], [place], [place] [phone]  [facility] [address], [place], [place]  [phone]  [facility] [address],  [place], [place] [phone] or [phone]  Substance Abuse Resources Organization         Address  Phone  Notes  [facility]  [phone]  [facility]  [phone]  [facility]  [phone]  [facility]  [phone]  Residential & Outpatient Substance Abuse Program  [phone]  Psychological Services Organization         Address  Phone  Notes  [facility]  [phone]- [phone]  [facility]  [phone]- [phone]  [facility] [address], [place] [phone] or [phone]   Mobile Crisis Teams Organization         Address  Phone  Notes  [facility], Mobile Crisis Care Unit  [phone]  [facility]  [address] [place], [place] [phone]  Doristine LocksSharon DeEsch [address] [place] [phone]   Self-Help/Support Groups Organization          Address  Phone             Notes  [facility] [employer] - variety of support groups  [phone] Call for more information Narcotics Anonymous (NA), [facility] [address], [place]  2 meetings at this location  Statisticianesidential Treatment Programs Organization         Address  Phone  Notes  [facility] [address],    [place]  [phone]  [facility]  [address], [place] [phone], [place], [place] [phone]  [facility] [address], [place] [phone] Admissions: 8am-3pm M-F [facility] [address]    [place], [place] [phone]  [facility] [address], [place], [place] [phone]  [facility] [address]  [place], [place] [phone]  [facility] - Intensive Outpatient [address], [place], [place] [phone]  [facility] ([facility] [address]  [place], [place] [phone] or [phone]  [facility] ([facility]) [address] [place], [place] [phone] Accepts Medicaid [facility] [address]  [place] [phone] Substance Abuse/Addiction Treatment  [facility] Resources Organization         Address  Phone  Notes  [facility]  [phone]  Angie FavaJulie Brannon, PhD [address], [address] [place], [place]   [phone] or [phone]  [facility]   [address] [place], [place] [phone]  [facility] [address], [place], [place] [phone] Insurance/Medicaid/sponsorship through [facility] [address]                                    [place], [place] [phone] Therapy/tele-psych/case [facility] [address]   , [place] [phone]   Dr. Lolly MustacheArfeen  [phone]  [facility]  [employer]  [facility]  Dept. 1) [address], [place] 2) [address] [place] 3)  [address], [place] [phone]  [phone]  [facility] Child Abuse Hotline [phone] or [phone] (After Hours)   Increase activity slowly  As directed       Consultations: Treatment Team:  Rounding Lbcardiology, MD  Procedures Performed:  Dg Chest Port 1 View  07/24/2013   CLINICAL DATA:  Chest pain, dizziness  EXAM: PORTABLE CHEST - 1 VIEW  COMPARISON:  11/26/2012  FINDINGS: The heart size and mediastinal contours are within normal limits. There is a dual lead cardiac pacer present. Both lungs are clear. The visualized skeletal structures are unremarkable.  IMPRESSION: No active disease.   Electronically Signed   By: Elige Ko   On: 07/24/2013 11:53    2D Echo:  Study Conclusions  - Left ventricle: The cavity size was normal. Wall thickness was increased in a pattern of mild LVH. Systolic function was normal. The estimated ejection fraction was in the range of 60% to 65%. Wall motion was normal; there were no regional wall motion abnormalities. Doppler parameters are consistent with abnormal left ventricular relaxation (grade 1 diastolic dysfunction). - Aortic valve: There was no stenosis. - Mitral valve: Trivial regurgitation. - Left atrium: The atrium was mildly dilated. - Right ventricle: The cavity size was normal. Systolic function was normal. - Pulmonary arteries: No complete TR doppler jet so unable to estimate PA systolic pressure. - Inferior vena cava: The vessel was normal in size; the respirophasic diameter changes were in the normal range (= 50%); findings are consistent with normal central venous pressure. Impressions:  - Normal LV size with mild LV hypertrophy. EF 60-65%. Normal RV size and systolic function. No significant valvular abnormalities. Transthoracic echocardiography. M-mode, complete 2D, spectral Doppler, and color Doppler.  Height: Height: 180cm. Height: 70.9in. Weight: Weight: 79kg. Weight: 173.8lb. Body mass index: BMI: 24.4kg/m^2. Body surface area: BSA: 1.45m^2. Blood pressure: 112/67. Patient status: Inpatient. Location: Bedside.    Cardiac Cath:   Cardiac Catheterization Procedure Note  Name: Jacob Pineda  MRN: [id number]  DOB: 03/15/1951  Procedure: Left Heart Cath, Selective Coronary Angiography, LV angiography  Indication: 63 yo WM with history of CAD s/p stents, presents with syncope and atypical chest pain.  Procedural Details: The right wrist was prepped, draped, and anesthetized with 1% lidocaine. Using the modified Seldinger technique, a 5 French sheath was introduced into the right radial artery. 3 mg of verapamil was administered through the sheath, weight-based unfractionated heparin was administered intravenously. Standard Judkins catheters were used for selective coronary angiography and left ventriculography. Catheter exchanges were performed over an exchange length guidewire. There were no immediate procedural complications. A TR band was used for radial hemostasis at the completion of the procedure. The patient was transferred to the post catheterization recovery area for further monitoring.  Procedural Findings:  Hemodynamics:  AO 132/77 mean 101 mm Hg  LV 131/17 mm Hg  Coronary angiography:  Coronary dominance: right  Left mainstem: Normal  Left anterior descending (LAD): Stent noted in proximal to mid LAD. Stents are widely patent with diffuse 20% disease. The first diagonal has mild disease proximally to 20%.  Left circumflex (LCx): There is a single large OM. It has a stent proximally that is widely patent.  Right coronary artery (RCA): mild calcification with 20-30% disease proximally.  Left ventriculography: Left ventricular systolic function is normal, LVEF is estimated at 50-55%,  there is no significant mitral regurgitation  Final Conclusions:  1. No obstructive CAD.  Stents in the LAD and LCx are patent.  2. Good LV function.  Recommendations: Continue medical Rx.  Theron Arista [facility]  07/25/2013, 10:31 AM  Admission HPI:  Chief complaint: Passed out, Chest Pain  History of Present Illness: Jacob Pineda is a 63 year old while male with PMH of CAD (reports 2 stents placed in 2012), Symptomatic Bradycardia (PPM placed in 2012), and CKD Stage 2. He reports he was in his normal state of health this morning when at around 9:30am he passed out and fell onto his couch. He reports a friend was present and told him he was unconscious for about 4 minutes. He denies any loss of bowel or bladder function, no tongue biting, no muscle jerking, no previous history of seizure activity. He denies HA. He denies any abdominal pain. He has no history of hypoglycemia. He denies any prodromal symptoms of dizziness, diaphoresis, nausea, palpitations, or chest pain. He reports that after he regained consciousness he had substernal left sided chest pain that radiates to his left flank. He reported that the pain was an 8/10 at worst, was not relieved by ASA or NTG by EMS. He does report that currently the pain is a 4/10 after a Nitro drip was started.   Review of Systems:  Review of Systems  Constitutional: Negative for fever, chills, weight loss, malaise/fatigue and diaphoresis.  HENT: Negative for congestion and sore throat.  Eyes: Negative for blurred vision and double vision.  Respiratory: Negative for cough and shortness of breath.  Cardiovascular: Positive for chest pain. Negative for palpitations, orthopnea, claudication and leg swelling.  Gastrointestinal: Negative for heartburn, nausea, vomiting, abdominal pain, diarrhea, constipation and blood in stool.  Genitourinary: Negative for dysuria, urgency and frequency.  Musculoskeletal: Positive for falls.  Skin: Positive for rash (Left foot for last 3 days).  Neurological: Positive for loss of consciousness. Negative for  dizziness, tremors, sensory change, speech change, focal weakness, seizures, weakness and headaches.   Physical Exam:  Blood pressure 125/75, pulse 71, temperature 98.2 F (36.8 C), temperature source Oral, resp. rate 16, height 5\' 11"  (1.803 m), weight 175 lb (79.379 kg), SpO2 99.00%.  Physical Exam  Nursing note and vitals reviewed.  Constitutional: He is oriented to person, place, and time and well-developed, well-nourished, and in no distress.  HENT:  Head: Normocephalic and atraumatic.  Eyes: Conjunctivae and EOM are normal.  Cardiovascular: Normal rate, regular rhythm and normal heart sounds.  No murmur heard. JVP present  Pulmonary/Chest: Effort normal. No respiratory distress. He has no wheezes. He has no rales.  Abdominal: Soft. Bowel sounds are normal. He exhibits no distension. There is no tenderness.  Musculoskeletal: He exhibits no edema.  Neurological: He is alert and oriented to person, place, and time.  Skin: Skin is warm and dry.  Psychiatric: Affect and judgment normal.     Hospital Course by problem list: 1. Chest pain (resolved)  2. Syncope, resolved.  3. Dizziness, resolved.  4. History of bradycardia status post pacemaker     63 y.o PMH CAD s/p stents, pacemaker for bradycardia, diastolic dysfunction. He presented after a syncopal episode and chest pain.   1. Chest pain (resolved)  Initially thought to be due to unstable angina. He was started on a NTG drip and as needed sublingual NTG. Due to his history of CAD a cardiac cath was performed by Dr. [place] 07/25/13 which showed patent stents in the  mid LAD and proximal circumflex, otherwise mild atherosclerosis. LVEF normal by echocardiogram. EP interrogated his pacemaker. ACS was ruled out as troponin enzymes were negative.  Cardiology recommended to continue medical therapy. He was on a BB and statin prior to admission and we added a statin and BB this admission (Lipitor 80 mg qhs and Metoprolol 12.5 mg bid).  Continue aspirin 81 mg.  He will establish outpatient cardiology with [facility] [place] or continue with [facility] and establish a PCP from the list of resources given.    2. Syncope, resolved.  Orthostatics negative on day of discharge. Likely vasovagal. He did not have any prodromal symptoms. No arrhythmias on telemetry. He was hypotensive intermittently initially on admission to the sbp 70s-90s and was given temporary IVF. Denies alcohol or drug use.   3. Dizziness, resolved.  Resolved prior to discharge.  Patient was not orthostatic on day of discharge, cbg was 122, BP was wnl. He felt a sensation of the room spinning which happened with positional change. If this continues to occur consider BPPV. He was started on Metoprolol this admission which has side effect of dizziness.   4. History of bradycardia status post pacemaker  Overall patient was paced at heart rate of 70 this admission though have an episode of HR of 57.   5. DVT prophylaxis Heparin subcutaneous changed to Lovenox subcutaneous.    Discharge Vitals:   BP 109/67  Pulse 71  Temp(Src) 98.6 F (37 C) (Oral)  Resp 20  Ht 5\' 11"  (1.803 m)  Wt 176 lb 5.9 oz (80 kg)  BMI 24.61 kg/m2  SpO2 99%  Discharge Labs:   Results for orders placed during the hospital encounter of 07/24/13 (from the past 24 hour(s))  CBC     Status: Abnormal   Collection Time    07/25/13 12:30 PM      Result Value Range   WBC 5.8  4.0 - 10.5 K/uL   RBC 4.07 (*) 4.22 - 5.81 MIL/uL   Hemoglobin 12.3 (*) 13.0 - 17.0 g/dL   HCT 40.9 (*) 81.1 - 91.4 %   MCV 90.2  78.0 - 100.0 fL   MCH 30.2  26.0 - 34.0 pg   MCHC 33.5  30.0 - 36.0 g/dL   RDW 78.2  95.6 - 21.3 %   Platelets 169  150 - 400 K/uL  CREATININE, SERUM     Status: Abnormal   Collection Time    07/25/13 12:30 PM      Result Value Range   Creatinine, Ser 1.03  0.50 - 1.35 mg/dL   GFR calc non Af Amer 75 (*) >90 mL/min   GFR calc Af Amer 87 (*) >90 mL/min  TROPONIN I     Status:  None   Collection Time    07/25/13  3:27 PM      Result Value Range   Troponin I <0.30  <0.30 ng/mL  GLUCOSE, CAPILLARY     Status: Abnormal   Collection Time    07/26/13  8:59 AM      Result Value Range   Glucose-Capillary 122 (*) 70 - 99 mg/dL   Comment 1 Notify RN      Results for Jacob Pineda, Jacob Pineda (MRN [id number]) as of 07/26/2013 11:19  Ref. Range 07/24/2013 11:51 07/24/2013 12:06 07/24/2013 12:23 07/24/2013 21:48 07/25/2013 02:30 07/25/2013 12:30 07/25/2013 15:27  Sodium Latest Range: 137-147 mEq/L 141  142  140    Potassium Latest Range: 3.7-5.3 mEq/L 4.2  3.9  4.1  Chloride Latest Range: 96-112 mEq/L 105  103  104    CO2 Latest Range: 19-32 mEq/L 25    27    Mean Plasma Glucose Latest Range: <117 mg/dL     161 (H)    BUN Latest Range: 6-23 mg/dL 11  10  16     Creatinine Latest Range: 0.50-1.35 mg/dL 0.96  0.45  4.09 8.11   Calcium Latest Range: 8.4-10.5 mg/dL 9.1    8.8    GFR calc non Af Amer Latest Range: >90 mL/min 69 (L)    71 (L) 75 (L)   GFR calc Af Amer Latest Range: >90 mL/min 80 (L)    83 (L) 87 (L)   Glucose Latest Range: 70-99 mg/dL 92  94  95    Calcium Ionized Latest Range: 1.13-1.30 mmol/L   1.29      Alkaline Phosphatase Latest Range: 39-117 U/L     56    Albumin Latest Range: 3.5-5.2 g/dL     3.2 (L)    AST Latest Range: 0-37 U/L     14    ALT Latest Range: 0-53 U/L     9    Total Protein Latest Range: 6.0-8.3 g/dL     6.2    Total Bilirubin Latest Range: 0.3-1.2 mg/dL     0.4    Troponin I Latest Range: <0.30 ng/mL    <0.30 <0.30  <0.30  Troponin i, poc Latest Range: 0.00-0.08 ng/mL  0.00       Cholesterol Latest Range: 0-200 mg/dL     914    Triglycerides Latest Range: <150 mg/dL     70    HDL Latest Range: >39 mg/dL     45    LDL (calc) Latest Range: 0-99 mg/dL     99    VLDL Latest Range: 0-40 mg/dL     14    Total CHOL/HDL Ratio No range found     3.5     Results for Jacob, Pineda (MRN [id number]) as of 07/26/2013 11:19  Ref. Range 07/24/2013 11:51  07/24/2013 12:23 07/25/2013 02:30 07/25/2013 12:30  WBC Latest Range: 4.0-10.5 K/uL 6.9  5.8 5.8  RBC Latest Range: 4.22-5.81 MIL/uL 4.51  4.20 (L) 4.07 (L)  Hemoglobin Latest Range: 13.0-17.0 g/dL 08.6 57.8 46.9 (L) 62.9 (L)  HCT Latest Range: 39.0-52.0 % 40.8 42.0 38.4 (L) 36.7 (L)  MCV Latest Range: 78.0-100.0 fL 90.5  91.4 90.2  MCH Latest Range: 26.0-34.0 pg 30.4  30.0 30.2  MCHC Latest Range: 30.0-36.0 g/dL 52.8  41.3 24.4  RDW Latest Range: 11.5-15.5 % 14.1  14.3 14.2  Platelets Latest Range: 150-400 K/uL 203  181 169    Results for Jacob, Pineda (MRN [id number]) as of 07/26/2013 11:19  Ref. Range 07/23/2012 17:46 07/25/2013 06:49  D-Dimer, Sharene Butters Latest Range: 0.00-0.48 ug/mL-FEU 0.42   Prothrombin Time Latest Range: 11.6-15.2 seconds  12.4  INR Latest Range: 0.00-1.49   0.94   Results for Jacob, Pineda (MRN [id number]) as of 07/26/2013 11:19  Ref. Range 07/25/2013 02:30  Hemoglobin A1C Latest Range: <5.7 % 5.7 (H)  Glucose Latest Range: 70-99 mg/dL 95  TSH Latest Range: 5.956-3.875 uIU/mL 1.621   Results for Jacob, Pineda (MRN [id number]) as of 07/26/2013 11:19  Ref. Range 07/25/2013 09:23  Amphetamines Latest Range: NONE DETECTED  NONE DETECTED  Barbiturates Latest Range: NONE DETECTED  NONE DETECTED  Benzodiazepines Latest Range: NONE DETECTED  NONE DETECTED  Opiates Latest Range: NONE DETECTED  POSITIVE (A)  COCAINE Latest Range: NONE DETECTED  NONE DETECTED  Tetrahydrocannabinol Latest Range: NONE DETECTED  NONE DETECTED   Results for Jacob, Pineda (MRN [id number]) as of 07/26/2013 11:19  Ref. Range 07/24/2013 19:59  MRSA by PCR Latest Range: NEGATIVE  NEGATIVE    Signed: Annett Gula, MD 07/26/2013, 11:19 AM   Time Spent on Discharge: >30 minutes Services Ordered on Discharge: none Equipment Ordered on Discharge: none

## 2013-07-26 NOTE — Progress Notes (Signed)
Subjective: Pt had dizziness at midnight last night.  He noticed when he turned his head the room was spinning and this continued until the early morning but has now resolved.  Nothing made his dizziness worse or better.  He denies nausea, vomiting, chest pain.  He feels better this am and is ready for discharge.  Orthostatics were negative.  Blood glucose was 122.  His HR has been paced in the 70s.     Objective: Vital signs in last 24 hours: Filed Vitals:   07/26/13 0726 07/26/13 0916 07/26/13 0919 07/26/13 0922  BP: 110/75 118/62 115/75 109/67  Pulse: 72 72 72 71  Temp: 98.6 F (37 C)     TempSrc: Oral     Resp:      Height:      Weight:      SpO2:       Weight change: 1 lb 5.9 oz (0.621 kg)  Intake/Output Summary (Last 24 hours) at 07/26/13 1037 Last data filed at 07/26/13 0900  Gross per 24 hour  Intake 1537.2 ml  Output   2625 ml  Net -1087.8 ml   Vitals reviewed. General: resting in bed, NAD HEENT: [place]/at, no scleral icterus Cardiac: RRR, no rubs, murmurs or gallops Pulm: clear to auscultation bilaterally, no wheezes, rales, or rhonchi Abd: soft, nontender, nondistended, BS present Ext: warm and well perfused, no pedal edema Neuro: alert and oriented X3, cranial nerves II-XII grossly intact  Lab Results: Basic Metabolic Panel:  Recent Labs Lab 07/24/13 1151 07/24/13 1223 07/25/13 0230 07/25/13 1230  NA 141 142 140  --   K 4.2 3.9 4.1  --   CL 105 103 104  --   CO2 25  --  27  --   GLUCOSE 92 94 95  --   BUN 11 10 16   --   CREATININE 1.10 1.20 1.07 1.03  CALCIUM 9.1  --  8.8  --    Liver Function Tests:  Recent Labs Lab 07/25/13 0230  AST 14  ALT 9  ALKPHOS 56  BILITOT 0.4  PROT 6.2  ALBUMIN 3.2*   CBC:  Recent Labs Lab 07/25/13 0230 07/25/13 1230  WBC 5.8 5.8  HGB 12.6* 12.3*  HCT 38.4* 36.7*  MCV 91.4 90.2  PLT 181 169   Cardiac Enzymes:  Recent Labs Lab 07/24/13 2148 07/25/13 0230 07/25/13 1527  TROPONINI <0.30 <0.30  <0.30   CBG:  Recent Labs Lab 07/26/13 0859  GLUCAP 122*   Hemoglobin A1C:  Recent Labs Lab 07/25/13 0230  HGBA1C 5.7*   Fasting Lipid Panel:  Recent Labs Lab 07/25/13 0230  CHOL 158  HDL 45  LDLCALC 99  TRIG 70  CHOLHDL 3.5   Thyroid Function Tests:  Recent Labs Lab 07/25/13 0230  TSH 1.621   Coagulation:  Recent Labs Lab 07/25/13 0649  LABPROT 12.4  INR 0.94   Urine Drug Screen: Drugs of Abuse     Component Value Date/Time   LABOPIA POSITIVE* 07/25/2013 0923   COCAINSCRNUR NONE DETECTED 07/25/2013 0923   LABBENZ NONE DETECTED 07/25/2013 0923   AMPHETMU NONE DETECTED 07/25/2013 0923   THCU NONE DETECTED 07/25/2013 0923   LABBARB NONE DETECTED 07/25/2013 0923    Misc. Labs: None   Micro Results: Recent Results (from the past 240 hour(s))  MRSA PCR SCREENING     Status: None   Collection Time    07/24/13  7:59 PM      Result Value Range Status   MRSA by  PCR NEGATIVE  NEGATIVE Final   Comment:            The GeneXpert MRSA Assay (FDA     approved for NASAL specimens     only), is one component of a     comprehensive MRSA colonization     surveillance program. It is not     intended to diagnose MRSA     infection nor to guide or     monitor treatment for     MRSA infections.   Studies/Results: Dg Chest Port 1 View  07/24/2013   CLINICAL DATA:  Chest pain, dizziness  EXAM: PORTABLE CHEST - 1 VIEW  COMPARISON:  11/26/2012  FINDINGS: The heart size and mediastinal contours are within normal limits. There is a dual lead cardiac pacer present. Both lungs are clear. The visualized skeletal structures are unremarkable.  IMPRESSION: No active disease.   Electronically Signed   By: Elige Ko   On: 07/24/2013 11:53   Medications:  Scheduled Meds: . aspirin EC  81 mg Oral Daily  . atorvastatin  80 mg Oral q1800  . enoxaparin (LOVENOX) injection  40 mg Subcutaneous Q24H  . hydrocerin   Topical BID  . metoprolol tartrate  12.5 mg Oral BID  . multivitamin  with minerals  1 tablet Oral Daily  . sodium chloride  3 mL Intravenous Q12H   Continuous Infusions:  PRN Meds:.sodium chloride, acetaminophen, acetaminophen, HYDROcodone-acetaminophen, nitroGLYCERIN, ondansetron (ZOFRAN) IV, ondansetron, sodium chloride Assessment/Plan: 63 y.o PMH CAD s/p stents, pacemaker for bradycardia, diastolic dysfunction.  He presented after a syncopal episode and chest pain.   #Chest pain (resolved)  -Initially though to be due to unstable angina.  He was started on a NTG drip and as need sublingual NTG.  Cardiac cath by Dr. [place] 07/25/13 showed patent stents is in the mid LAD and proximal circumflex, otherwise mild atherosclerosis. LVEF normal by echocardiogram. -EP interrogated his pacemaker.  ACS ruled out as troponin enzymes were negative.    -Continue medical therapy.  Added statin and BB this admission (Lipitor 80 mg qhs and Metoprolol 12.5 mg bid). Continue aspirin 81 mg.    -Pt will establish outpatient cardiology or continue with [facility] and establish a PCP  #Syncope, resolved.  -Orthostatics negative on day of discharge.  Likely vasovagal.  He did not have any prodromal sx's.  No arrhythmias on telemetry.  He was hypotensive intermittently initially on admission to the sbp 70s-90s and was given temporary IVF.  Denies alcohol or drug use.  #Dizziness, resolved.   -Pt was not orthostatic, cbg was 122, BP was wnl.  Room spinning happened with positional change.  If this continues to occur consider BPPV.  He was started on Metoprolol this admission which has side effect of dizziness.    #History of bradycardia s/p pacemaker -Overall patient was paced at heart rate of 70 this admission though have an episode of HR of 57.     #DVT px  -Heparin subcutaneous changed to Lovenox  #F/E/N -NSL -cardiac diet   Dispo: D/c home today    The patient does not have a current PCP and was given a list of resources to establish as PCP.  He also will  establish a cardiology in [facility] or [place] and was told to call due to discharge date being on the weekend.    The patient does have transportation limitations that hinder transportation to clinic appointments.  .Services Needed at time of discharge: Y = Yes,  Blank = No PT:   OT:   RN:   Equipment:   Other:     LOS: 2 days   Annett Gularacy N McLean, MD [phone] 07/26/2013, 10:37 AM

## 2013-07-26 NOTE — Discharge Instructions (Signed)
Please read all the information given.  Pleasure taking care of you Happy New Year!  Coronary Artery Disease, Risk Factors Research has shown that the risk of developing coronary artery disease (CAD) and having a heart attack increases with each factor you have. RISK FACTORS YOU CANNOT CHANGE  Your age. Your risk goes up as you get older. Most heart attacks happen to people over the age of 61.  Gender. Men have a greater risk of heart attack than women, and they have attacks earlier in life. However, women are more likely to die from a heart attack.  Heredity. Children of parents with heart disease are more likely to develop it themselves.  Race. African Americans and other ethnic groups have a higher risk, possibly because of high blood pressure, a tendency toward obesity, and diabetes.  Your family. Most people with a strong family history of heart disease have one or more other risk factors. RISK FACTORS YOU CAN CHANGE  Exposure to tobacco smoke. Even secondhand smoke greatly increases the risk for heart disease.  High blood cholesterol may be lowered with changes in diet, activity, and medicines.  High blood pressure makes the heart work harder. This causes the heart muscles to become thick and, eventually, weaker. It also increases your risk of stroke, heart attack, and kidney or heart failure.  Physical inactivity is a risk factor for CAD. Regular physical activity helps prevent heart and blood vessel disease. Exercise helps control blood cholesterol, diabetes, obesity, and it may help lower blood pressure in some people.  Excess body fat, especially belly fat, increases the risk of heart disease and stroke even if there are no other risk factors. Excess weight increases the heart's workload and raises blood pressure and blood cholesterol.  Diabetes seriously increases your risk of developing CAD. If you have diabetes, you should work with your caregiver to manage it and control  other risk factors. OTHER RISK FACTORS FOR CAD  How you respond to stress.  Drinking too much alcohol may raise blood pressure, cause heart failure, and lead to stroke.  Total cholesterol greater than 200 milligrams.  HDL (good) cholesterol less than 40 milligrams. HDL helps keep cholesterol from building up in the walls of the arteries. PREVENTING CAD  Maintain a healthy weight.  Exercise or do physical activity.  Eat a heart-healthy diet low in fat and salt and high in fiber.  Control your blood pressure to keep it below 120 over 80.  Keep your cholesterol at a level that lowers your risk.  Manage diabetes if you have it.  Stop smoking.  Learn how to manage stress. HEART SMART SUBSTITUTIONS  Instead of whole or 2% milk and cream, use skim milk.  Instead of fried foods, eat baked, steamed, boiled, broiled, or microwaved foods.  Instead of lard, butter, palm and coconut oils, cook with unsaturated vegetable oils, such as corn, olive, canola, safflower, sesame, soybean, sunflower, or peanut.  Instead of fatty cuts of meat, eat lean cuts of meat or cut off the fatty parts.  Instead of 1 whole egg in recipes, use 2 egg whites.  Instead of sauces, butter, and salt, season vegetables with herbs and spices.  Instead of regular hard and processed cheeses, eat low-fat, low-sodium cheeses.  Instead of salted potato chips, choose low-fat, unsalted tortilla and potato chips and unsalted pretzels and popcorn.  Instead of sour cream and mayonnaise, use plain low-fat yogurt, low-fat cottage cheese, or low-fat or "light" sour cream. FOR MORE INFORMATION  National  Heart Lung and Blood Institute: https://nielsen.com/ American Heart Association: [employer].is Document Released: 09/30/2003 Document Revised: 10/02/2011 Document Reviewed: 09/25/2007 [facility] Patient Information 2014 [place], [place].  Angina Pectoris Angina pectoris is extreme discomfort in your  chest, neck, or arm. Your doctor may call it just angina. It is caused by a lack of oxygen to your heart wall. It may feel like tightness or heavy pressure. It may feel like a crushing or squeezing pain. Some people say it feels like gas. It may go down your shoulders, back, and arms. Some people have symptoms other than pain. These include:  Tiredness.  Shortness of breath.  Cold sweats.  Feeling sick to your stomach (nausea). There are four types of angina:  Stable angina often lasts the same amount of time each time it happens. Activity, stress, or excitement can bring it on. It often gets better after taking a special medicine called nitroglycerin. This goes under your tongue.  Unstable angina can happen when you are not active or even during sleep. It can suddenly get worse or happen more often. It may not get better after taking the special medicine. It can last up to 30 minutes.  Microvascular angina is more common in women. It may be more severe or last longer than other types.  Prinzmetal angina often happens when you are not active or in the early morning hours. HOME CARE   Only take medicines as told by your doctor.  Stay active or exercise more as told by your doctor.  Limit very hard activity as told by your doctor.  Limit heavy lifting as told by your doctor.  Keep a healthy weight.  Learn about and eat foods that are healthy for your heart.  Do not smoke. GET HELP RIGHT AWAY IF:   You have chest, neck, deep shoulder, or arm pain or discomfort that lasts more than a few minutes.  You have chest, neck, deep shoulder, or arm pain or discomfort that goes away and comes back over and over again.  You have heavy sweating that seems to happen for no reason.  You have shortness of breath or trouble breathing.  Your angina does not get better after a few minutes of rest.  Your angina does not get better after you take nitroglycerin medicine. These can all be  symptoms of a heart attack. Get help right away. Call your local emergency service (911 in [place] Do not  drive yourself to the hospital. Do not  wait to for your symptoms to go away. MAKE SURE YOU:   Understand these instructions.  Will watch your condition.  Will get help right away if you are not doing well or get worse. Document Released: 12/27/2007 Document Revised: 06/26/2012 Document Reviewed: 04/18/2012 [facility] Patient Information 2014 [place], [place].  Chest Pain (Nonspecific) It is often hard to give a specific diagnosis for the cause of chest pain. There is always a chance that your pain could be related to something serious, such as a heart attack or a blood clot in the lungs. You need to follow up with your caregiver for further evaluation. CAUSES   Heartburn.  Pneumonia or bronchitis.  Anxiety or stress.  Inflammation around your heart (pericarditis) or lung (pleuritis or pleurisy).  A blood clot in the lung.  A collapsed lung (pneumothorax). It can develop suddenly on its own (spontaneous pneumothorax) or from injury (trauma) to the chest.  Shingles infection (herpes zoster virus). The chest wall is composed of bones, muscles, and cartilage. Any  of these can be the source of the pain.  The bones can be bruised by injury.  The muscles or cartilage can be strained by coughing or overwork.  The cartilage can be affected by inflammation and become sore (costochondritis). DIAGNOSIS  Lab tests or other studies, such as X-rays, electrocardiography, stress testing, or cardiac imaging, may be needed to find the cause of your pain.  TREATMENT   Treatment depends on what may be causing your chest pain. Treatment may include:  Acid blockers for heartburn.  Anti-inflammatory medicine.  Pain medicine for inflammatory conditions.  Antibiotics if an infection is present.  You may be advised to change lifestyle habits. This includes stopping smoking and avoiding  alcohol, caffeine, and chocolate.  You may be advised to keep your head raised (elevated) when sleeping. This reduces the chance of acid going backward from your stomach into your esophagus.  Most of the time, nonspecific chest pain will improve within 2 to 3 days with rest and mild pain medicine. HOME CARE INSTRUCTIONS   If antibiotics were prescribed, take your antibiotics as directed. Finish them even if you start to feel better.  For the next few days, avoid physical activities that bring on chest pain. Continue physical activities as directed.  Do not smoke.  Avoid drinking alcohol.  Only take over-the-counter or prescription medicine for pain, discomfort, or fever as directed by your caregiver.  Follow your caregiver's suggestions for further testing if your chest pain does not go away.  Keep any follow-up appointments you made. If you do not go to an appointment, you could develop lasting (chronic) problems with pain. If there is any problem keeping an appointment, you must call to reschedule. SEEK MEDICAL CARE IF:   You think you are having problems from the medicine you are taking. Read your medicine instructions carefully.  Your chest pain does not go away, even after treatment.  You develop a rash with blisters on your chest. SEEK IMMEDIATE MEDICAL CARE IF:   You have increased chest pain or pain that spreads to your arm, neck, jaw, back, or abdomen.  You develop shortness of breath, an increasing cough, or you are coughing up blood.  You have severe back or abdominal pain, feel nauseous, or vomit.  You develop severe weakness, fainting, or chills.  You have a fever. THIS IS AN EMERGENCY. Do not wait to see if the pain will go away. Get medical help at once. Call your local emergency services (911 in [place] Do not drive yourself to the hospital. MAKE SURE YOU:   Understand these instructions.  Will watch your condition.  Will get help right away if you are not  doing well or get worse. Document Released: 04/19/2005 Document Revised: 10/02/2011 Document Reviewed: 02/13/2008 [facility] Patient Information 2014 [place], [place].

## 2013-07-26 NOTE — Progress Notes (Addendum)
Primary cardiologist: [facility] Consulting cardiologist: Dr. Charlton HawsPeter Pineda  Subjective:    No chest pain or shortness of breath. Felt "swimmy headed" last my around midnight when he was supine. No arrhythmias, not orthostatic. States that he felt like the room was "spinning."  Objective:   Temp:  [97.8 F (36.6 C)-98.6 F (37 C)] 98.6 F (37 C) (01/03 0726) Pulse Rate:  [57-83] 72 (01/03 0726) Resp:  [11-20] 20 (01/03 0448) BP: (106-146)/(49-87) 110/75 mmHg (01/03 0726) SpO2:  [96 %-99 %] 99 % (01/03 0448) Weight:  [176 lb 5.9 oz (80 kg)] 176 lb 5.9 oz (80 kg) (01/03 0500) Last BM Date: 07/24/12  Filed Weights   07/24/13 1956 07/26/13 0031 07/26/13 0500  Weight: 175 lb 0.7 oz (79.4 kg) 176 lb 5.9 oz (80 kg) 176 lb 5.9 oz (80 kg)    Intake/Output Summary (Last 24 hours) at 07/26/13 0755 Last data filed at 07/26/13 0646  Gross per 24 hour  Intake 1417.2 ml  Output   2325 ml  Net -907.8 ml    Telemetry: Atrial paced rhythm, occasional ventricular pacing.  Exam:  General: Appears comfortable, in no distress.  Lungs: Clear, nonlabored.  Cardiac: RRR, no gallop.  Extremities: No pitting.  Lab Results:  Basic Metabolic Panel:  Recent Labs Lab 07/24/13 1151 07/24/13 1223 07/25/13 0230 07/25/13 1230  NA 141 142 140  --   K 4.2 3.9 4.1  --   CL 105 103 104  --   CO2 25  --  27  --   GLUCOSE 92 94 95  --   BUN 11 10 16   --   CREATININE 1.10 1.20 1.07 1.03  CALCIUM 9.1  --  8.8  --     Liver Function Tests:  Recent Labs Lab 07/25/13 0230  AST 14  ALT 9  ALKPHOS 56  BILITOT 0.4  PROT 6.2  ALBUMIN 3.2*    CBC:  Recent Labs Lab 07/24/13 1151 07/24/13 1223 07/25/13 0230 07/25/13 1230  WBC 6.9  --  5.8 5.8  HGB 13.7 14.3 12.6* 12.3*  HCT 40.8 42.0 38.4* 36.7*  MCV 90.5  --  91.4 90.2  PLT 203  --  181 169    Cardiac Enzymes:  Recent Labs Lab 07/24/13 2148 07/25/13 0230 07/25/13 1527  TROPONINI <0.30 <0.30 <0.30     Echocardiogram: Study Conclusions  - Left ventricle: The cavity size was normal. Wall thickness was increased in a pattern of mild LVH. Systolic function was normal. The estimated ejection fraction was in the range of 60% to 65%. Wall motion was normal; there were no regional wall motion abnormalities. Doppler parameters are consistent with abnormal left ventricular relaxation (grade 1 diastolic dysfunction). - Aortic valve: There was no stenosis. - Mitral valve: Trivial regurgitation. - Left atrium: The atrium was mildly dilated. - Right ventricle: The cavity size was normal. Systolic function was normal. - Pulmonary arteries: No complete TR doppler jet so unable to estimate PA systolic pressure. - Inferior vena cava: The vessel was normal in size; the respirophasic diameter changes were in the normal range (= 50%); findings are consistent with normal central venous pressure. Impressions:  - Normal LV size with mild LV hypertrophy. EF 60-65%. Normal RV size and systolic function. No significant valvular abnormalities.   Medications:   Scheduled Medications: . aspirin EC  81 mg Oral Daily  . atorvastatin  80 mg Oral q1800  . enoxaparin (LOVENOX) injection  40 mg Subcutaneous Q24H  . hydrocerin  Topical BID  . metoprolol tartrate  12.5 mg Oral BID  . multivitamin with minerals  1 tablet Oral Daily  . sodium chloride  3 mL Intravenous Q12H      PRN Medications:  sodium chloride, acetaminophen, acetaminophen, HYDROcodone-acetaminophen, nitroGLYCERIN, ondansetron (ZOFRAN) IV, ondansetron, sodium chloride   Assessment:   1. Possible vasovagal syncope. Not frankly orthostatic. Atrial pacing by ECG. No arrhythmias by telemetry. He tells me he had an episode of "head swimming" last night around midnight when he was supine. Not orthostatic. He describes more of a sense of vertigo than actual dizziness. Does not fit well with a cardiac etiology in light of his  workup.  2. Known CAD, no evidence of ACS by enzymes. Cardiac catheterization by Dr. [place] in January 2 showed patent stents is in the mid LAD and proximal circumflex, otherwise mild atherosclerosis. LVEF normal by echocardiogram.  3. History of pacemaker placement, followed by [facility] cardiology.   Plan/Discussion:    Continue medical therapy for treatment of coronary artery disease. No further cardiac workup anticipated at this time. He should keep his regular visits with [facility] cardiology.   Jonelle Sidle, M.D., F.A.C.C.

## 2013-08-06 NOTE — Discharge Summary (Signed)
As a matter of clarification, please note that [name] was not on a beta blocker or statin upon admission but was discharged on these classes of medication.

## 2014-07-02 ENCOUNTER — Encounter (HOSPITAL_COMMUNITY): Payer: Self-pay | Admitting: Cardiology

## 2014-12-26 ENCOUNTER — Encounter (HOSPITAL_COMMUNITY): Payer: Self-pay

## 2014-12-26 ENCOUNTER — Emergency Department (HOSPITAL_COMMUNITY): Payer: Medicare Other

## 2014-12-26 ENCOUNTER — Emergency Department (HOSPITAL_COMMUNITY)
Admission: EM | Admit: 2014-12-26 | Discharge: 2014-12-26 | Disposition: A | Discharge status: Home / Self Care | Payer: Medicare Other | Attending: Emergency Medicine | Admitting: Emergency Medicine

## 2014-12-26 DIAGNOSIS — Z9861 Coronary angioplasty status: Secondary | ICD-10-CM | POA: Insufficient documentation

## 2014-12-26 DIAGNOSIS — I5189 Other ill-defined heart diseases: Secondary | ICD-10-CM | POA: Diagnosis present

## 2014-12-26 DIAGNOSIS — Z7982 Long term (current) use of aspirin: Secondary | ICD-10-CM | POA: Insufficient documentation

## 2014-12-26 DIAGNOSIS — R200 Anesthesia of skin: Secondary | ICD-10-CM | POA: Insufficient documentation

## 2014-12-26 DIAGNOSIS — R1031 Right lower quadrant pain: Secondary | ICD-10-CM

## 2014-12-26 DIAGNOSIS — R55 Syncope and collapse: Secondary | ICD-10-CM | POA: Diagnosis present

## 2014-12-26 DIAGNOSIS — R51 Headache: Secondary | ICD-10-CM | POA: Insufficient documentation

## 2014-12-26 DIAGNOSIS — Z8673 Personal history of transient ischemic attack (TIA), and cerebral infarction without residual deficits: Secondary | ICD-10-CM | POA: Insufficient documentation

## 2014-12-26 DIAGNOSIS — Z9889 Other specified postprocedural states: Secondary | ICD-10-CM | POA: Diagnosis not present

## 2014-12-26 DIAGNOSIS — R519 Headache, unspecified: Secondary | ICD-10-CM | POA: Insufficient documentation

## 2014-12-26 DIAGNOSIS — I639 Cerebral infarction, unspecified: Secondary | ICD-10-CM | POA: Diagnosis present

## 2014-12-26 DIAGNOSIS — R001 Bradycardia, unspecified: Secondary | ICD-10-CM | POA: Insufficient documentation

## 2014-12-26 DIAGNOSIS — I5032 Chronic diastolic (congestive) heart failure: Secondary | ICD-10-CM | POA: Insufficient documentation

## 2014-12-26 DIAGNOSIS — Z79899 Other long term (current) drug therapy: Secondary | ICD-10-CM | POA: Diagnosis not present

## 2014-12-26 DIAGNOSIS — K5790 Diverticulosis of intestine, part unspecified, without perforation or abscess without bleeding: Secondary | ICD-10-CM | POA: Diagnosis not present

## 2014-12-26 DIAGNOSIS — R531 Weakness: Secondary | ICD-10-CM | POA: Insufficient documentation

## 2014-12-26 DIAGNOSIS — Z950 Presence of cardiac pacemaker: Secondary | ICD-10-CM | POA: Diagnosis present

## 2014-12-26 DIAGNOSIS — M5126 Other intervertebral disc displacement, lumbar region: Secondary | ICD-10-CM | POA: Diagnosis present

## 2014-12-26 DIAGNOSIS — Z87891 Personal history of nicotine dependence: Secondary | ICD-10-CM | POA: Insufficient documentation

## 2014-12-26 DIAGNOSIS — R471 Dysarthria and anarthria: Secondary | ICD-10-CM | POA: Diagnosis present

## 2014-12-26 DIAGNOSIS — I519 Heart disease, unspecified: Secondary | ICD-10-CM

## 2014-12-26 DIAGNOSIS — I2510 Atherosclerotic heart disease of native coronary artery without angina pectoris: Secondary | ICD-10-CM | POA: Diagnosis not present

## 2014-12-26 DIAGNOSIS — R109 Unspecified abdominal pain: Secondary | ICD-10-CM | POA: Diagnosis present

## 2014-12-26 DIAGNOSIS — R079 Chest pain, unspecified: Secondary | ICD-10-CM | POA: Insufficient documentation

## 2014-12-26 DIAGNOSIS — G4489 Other headache syndrome: Secondary | ICD-10-CM

## 2014-12-26 DIAGNOSIS — R0789 Other chest pain: Secondary | ICD-10-CM | POA: Diagnosis present

## 2014-12-26 HISTORY — DX: Cerebral infarction, unspecified: I63.9

## 2014-12-26 LAB — I-STAT TROPONIN, ED
TROPONIN I, POC: 0.03 ng/mL (ref 0.00–0.08)
Troponin i, poc: 0.01 ng/mL (ref 0.00–0.08)

## 2014-12-26 LAB — BASIC METABOLIC PANEL
ANION GAP: 9 (ref 5–15)
BUN: 10 mg/dL (ref 6–20)
CHLORIDE: 107 mmol/L (ref 101–111)
CO2: 26 mmol/L (ref 22–32)
Calcium: 9.9 mg/dL (ref 8.9–10.3)
Creatinine, Ser: 1.37 mg/dL — ABNORMAL HIGH (ref 0.61–1.24)
GFR calc Af Amer: >60 mL/min (ref >60)
GFR calc non Af Amer: 53 mL/min — ABNORMAL LOW (ref >60)
GLUCOSE: 114 mg/dL — AB (ref 65–99)
Potassium: 4.9 mmol/L (ref 3.5–5.1)
Sodium: 142 mmol/L (ref 135–145)

## 2014-12-26 LAB — CBC
HEMATOCRIT: 43.8 % (ref 39.0–52.0)
HEMOGLOBIN: 14.2 g/dL (ref 13.0–17.0)
MCH: 30.4 pg (ref 26.0–34.0)
MCHC: 32.4 g/dL (ref 30.0–36.0)
MCV: 93.8 fL (ref 78.0–100.0)
PLATELETS: 217 10*3/uL (ref 150–400)
RBC: 4.67 MIL/uL (ref 4.22–5.81)
RDW: 14.2 % (ref 11.5–15.5)
WBC: 7.7 10*3/uL (ref 4.0–10.5)

## 2014-12-26 LAB — BRAIN NATRIURETIC PEPTIDE: B NATRIURETIC PEPTIDE 5: 50.1 pg/mL (ref 0.0–100.0)

## 2014-12-26 MED ORDER — ACETAMINOPHEN 500 MG PO TABS
1000.0000 mg | ORAL_TABLET | Freq: Once | ORAL | Status: AC
  Administered 2014-12-26: 1000 mg via ORAL
  Filled 2014-12-26: qty 2

## 2014-12-26 MED ORDER — SODIUM CHLORIDE 0.9 % IV SOLN: INTRAVENOUS | Status: DC

## 2014-12-26 MED ORDER — IOHEXOL 300 MG/ML  SOLN
100.0000 mL | Freq: Once | INTRAMUSCULAR | Status: AC | PRN
  Administered 2014-12-26: 100 mL via INTRAVENOUS

## 2014-12-26 MED ORDER — MORPHINE SULFATE 4 MG/ML IJ SOLN
6.0000 mg | Freq: Once | INTRAMUSCULAR | Status: AC
  Administered 2014-12-26: 6 mg via INTRAVENOUS
  Filled 2014-12-26: qty 2

## 2014-12-26 MED ORDER — IOHEXOL 300 MG/ML  SOLN
25.0000 mL | Freq: Once | INTRAMUSCULAR | Status: AC | PRN
  Administered 2014-12-26: 25 mL via ORAL

## 2014-12-26 MED ORDER — SODIUM CHLORIDE 0.9 % IV SOLN: INTRAVENOUS | Status: AC

## 2014-12-26 NOTE — Discharge Instructions (Signed)
Abdominal Pain °Many things can cause abdominal pain. Usually, abdominal pain is not caused by a disease and will improve without treatment. It can often be observed and treated at home. Your health care provider will do a physical exam and possibly order blood tests and X-rays to help determine the seriousness of your pain. However, in many cases, more time must pass before a clear cause of the pain can be found. Before that point, your health care provider may not know if you need more testing or further treatment. °HOME CARE INSTRUCTIONS  °Monitor your abdominal pain for any changes. The following actions may help to alleviate any discomfort you are experiencing: °· Only take over-the-counter or prescription medicines as directed by your health care provider. °· Do not take laxatives unless directed to do so by your health care provider. °· Try a clear liquid diet (broth, tea, or water) as directed by your health care provider. Slowly move to a bland diet as tolerated. °SEEK MEDICAL CARE IF: °· You have unexplained abdominal pain. °· You have abdominal pain associated with nausea or diarrhea. °· You have pain when you urinate or have a bowel movement. °· You experience abdominal pain that wakes you in the night. °· You have abdominal pain that is worsened or improved by eating food. °· You have abdominal pain that is worsened with eating fatty foods. °· You have a fever. °SEEK IMMEDIATE MEDICAL CARE IF:  °· Your pain does not go away within 2 hours. °· You keep throwing up (vomiting). °· Your pain is felt only in portions of the abdomen, such as the right side or the left lower portion of the abdomen. °· You pass bloody or black tarry stools. °MAKE SURE YOU: °· Understand these instructions.   °· Will watch your condition.   °· Will get help right away if you are not doing well or get worse.   °Document Released: 04/19/2005 Document Revised: 07/15/2013 Document Reviewed: 03/19/2013 °ExitCare® Patient Information  ©2015 [employer]. This information is not intended to replace advice given to you by your health care provider. Make sure you discuss any questions you have with your health care provider. ° °General Headache Without Cause °A general headache is pain or discomfort felt around the head or neck area. The cause may not be found.  °HOME CARE  °· Keep all doctor visits. °· Only take medicines as told by your doctor. °· Lie down in a dark, quiet room when you have a headache. °· Keep a journal to find out if certain things bring on headaches. For example, write down: °¨ What you eat and drink. °¨ How much sleep you get. °¨ Any change to your diet or medicines. °· Relax by getting a massage or doing other relaxing activities. °· Put ice or heat packs on the head and neck area as told by your doctor. °· Lessen stress. °· Sit up straight. Do not tighten (tense) your muscles. °· Quit smoking if you smoke. °· Lessen how much alcohol you drink. °· Lessen how much caffeine you drink, or stop drinking caffeine. °· Eat and sleep on a regular schedule. °· Get 7 to 9 hours of sleep, or as told by your doctor. °· Keep lights dim if bright lights bother you or make your headaches worse. °GET HELP RIGHT AWAY IF:  °· Your headache becomes really bad. °· You have a fever. °· You have a stiff neck. °· You have trouble seeing. °· Your muscles are weak, or you lose   muscle control. °· You lose your balance or have trouble walking. °· You feel like you will pass out (faint), or you pass out. °· You have really bad symptoms that are different than your first symptoms. °· You have problems with the medicines given to you by your doctor. °· Your medicines do not work. °· Your headache feels different than the other headaches. °· You feel sick to your stomach (nauseous) or throw up (vomit). °MAKE SURE YOU:  °· Understand these instructions. °· Will watch your condition. °· Will get help right away if you are not doing well or get  worse. °Document Released: 04/18/2008 Document Revised: 10/02/2011 Document Reviewed: 06/30/2011 °ExitCare® Patient Information ©2015 [employer]. This information is not intended to replace advice given to you by your health care provider. Make sure you discuss any questions you have with your health care provider. ° °

## 2014-12-26 NOTE — ED Notes (Signed)
Report from [facility], reading is clear at this time, no changes. Dr. Littie DeedsGentry to be notified.

## 2014-12-26 NOTE — Consult Note (Signed)
[facility] Medical Consultation  Clarisa SchoolsDarrell W Lingelbach QMV:[id number] DOB: 21-Mar-1951 DOA: 12/26/2014 PCP: Default, Provider, MD   Requesting physician: Dr. Elwin MochaBlair Walden Date of consultation: 12/26/2014 Reason for consultation: Consideration for admission for ACS rule out.  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 Chest x-ray is negative for acute changes, injury or opacity. EKG does not reflect ischemic changes. Doubt acute coronary syndrome, but recommend checking a follow-up troponin I in order to further decrease concern for ACS.   [facility] with right-sided weakness, decreased right-sided sensation and dysarthria. He received TPA, was monitored. Symptoms improved but were not yet back to baseline. He was discharged home on 6/1.   [facility] the patient reports worsening dysarthria with ongoing decreased sensation on the right and slight right-sided extremity weakness. He also complains of a headache in the occipital area.  Would recommend CT head now to rule out hemorrhagic transformation of the CVA and contacting [facility] to discuss current worsening dysarthria.   [facility] for continued neurological workup if felt appropriate by [facility] and [facility].  Recurrent syncope The patient has a history of recurrent syncope.  Pace maker has been placed. Again would check CT head for any new acute changes.   Creatinine is mildly elevated will give a low dose bolus of IVF as he may be mildly dehydrated.  Currently on metoprolol with rate well controlled in the 70s.  Diastolic  [facility] done [facility]/31 shows LVEF of 62% with impaired LV filling. No need to repeat echo.  Patient is not volume overloaded, rather he appears a bit dry.  L4-L5 disc herniation  Question of mass effect on the L5 root per CT abdomen pelvis done 6/4. This could be contributing to right sided leg weakness and decreased sensation.  Patient's spine is mildly tender to palpation.  Strongly recommend follow up with neuro surg or ortho surg.  Patient not an immediate candidate for surgery at this time due to recent TPA administration.  Thank you for this consultation.  Chief Complaint: Recurrent syncopal episodes   HPI:  Mr. Sherril CroonDarrell Dreisbach is a 64 year old male with a past medical history significant for recurrent syncope, bradycardia status post pacemaker placement, CVA, and mild diastolic dysfunction. [facility] after being treated with TPA for a stroke. [name] reports that this morning he felt an episode of pain in the area of his right rib cage and subsequently passed out for approximately 4 minutes. When he awoke the pain in his right side was worse. He waited 10 minutes, up to go to the bathroom and passed out again for 2-3 minutes. He describes no tongue biting, urinary or bowel incontinence.  He states his friend describe no jerking motions of his extremities. In the ER his right-sided pain is decreased. Labs appear essentially normal with the exception of a mildly elevated creatinine (1.3).  Two-view chest is negative for any acute abnormality. A CT abdomen and pelvis was done that showed broad-based disc herniation in his L4-L5 area.  After examining Mr. Valley, reviewing the workup, as well as his past medical history we discussed his case with the ER physician. Our recommendations are that a troponin be repeated in order to further decrease concerns of ACS.  The patient's pain is very atypical, his EKG is  unremarkable and an echo done 5/31 shows a normal ejection fraction with out wall motion abnormalities.  In addition we recommend CT head in order to evaluate for abnormalities including a possible hemorrhagic conversion of his stroke. Based on the results of this CT we would recommend discussions with [facility] and consideration of transfer for further neurological workup given his recent admission and TPA administration.  Finally we recommend appropriate follow-up for his L4-L5 disc herniation with neurosurgery/orthopedic surgery.  Review of Systems:  + + Headache He denies dizziness, double vision, difficulty swallowing, typical chest pain, palpitations, abdominal pain, anorexia, vomiting, changes in his bowel habits, dysuria, myalgias.   Past Medical History  Diagnosis Date  . Coronary artery disease   . Pacemaker   . Diastolic dysfunction   . Stroke    Past Surgical History  Procedure Laterality Date  . Pacemaker insertion    . Cardiac stents times 2    . Cholecystectomy    . Left heart catheterization with coronary angiogram N/A 07/25/2013    Procedure: LEFT HEART CATHETERIZATION WITH CORONARY ANGIOGRAM;  Surgeon: Laurey Morale, MD;  Location: [facility] CATH LAB;  Service: Cardiovascular;  Laterality: N/A;   Social History:  reports that he has quit smoking. He does not have any smokeless tobacco history on file. He reports that he does not drink alcohol or use illicit drugs.  he lives alone, but is currently staying with a friend since his discharge after the stroke. He is independent with his ADLs.  No Known Allergies He denies family history of stroke.  Prior to Admission medications   Medication Sig Start Date End Date Taking? Authorizing Provider  acetaminophen (TYLENOL) 500 MG tablet Take 1,000 mg by mouth daily as needed for pain.    Historical Provider, MD  aspirin EC 81 MG tablet Take 81 mg by mouth daily.    Historical Provider, MD  atorvastatin (LIPITOR) 80 MG tablet Take  1 tablet (80 mg total) by mouth daily at 6 PM. 07/26/13   Annett Gula, MD  metoprolol tartrate (LOPRESSOR) 12.5 mg TABS tablet Take 0.5 tablets (12.5 mg total) by mouth 2 (two) times daily. 07/26/13   Annett Gula, MD  Multiple Vitamin (MULTIVITAMIN WITH MINERALS) TABS Take 1 tablet by mouth daily.    Historical Provider, MD   Physical Exam: Blood pressure 159/88, pulse 73, temperature 98.9 F (37.2 C), temperature source Oral, resp. rate 13, height  (1.803 m), weight 76.204 kg (168 lb), SpO2 100 %. Filed Vitals:   12/26/14 1734  BP:   Pulse: 73  Temp:   Resp: 13     General:  Thin, well-developed male, flushed slightly reddened face and back, no apparent distress.  Eyes: Mildly hyperemic, conjunctiva are pink, sclera clear, pupils are equal  ENT: Moist because membranes, no exudates or erythema in his oropharynx.  Neck: Supple without lymphadenopathy  Cardiovascular: Regular rate and rhythm, no obvious murmurs rubs or gallops.  Respiratory: Clear to auscultation, no accessory muscle use or increased work of breathing.  Abdomen: Soft, nontender, nondistended, positive bowel sounds  Skin: Flushed pink on his face and back. Otherwise no rashes lesions or bruises.  Musculoskeletal: tenderness elicited when palpating the lumbar  portion of his spine. Patient is able to move all 4 extremities. There are no effusions.  Psychiatric: alert and oriented, cooperative, appropriate  Neurologic: tongue deviates slightly right, mildly decreased strength noted in the right-sided extremities. Patient reports decreased sensation in the right sided extremities compared to the left.   Labs on Admission:   Basic Metabolic Panel:  Recent Labs Lab 12/26/14 1211  NA 142  K 4.9  CL 107  CO2 26  GLUCOSE 114*  BUN 10  CREATININE 1.37*  CALCIUM 9.9    CBC:  Recent Labs Lab 12/26/14 1211  WBC 7.7  HGB 14.2  HCT 43.8  MCV 93.8  PLT 217    Radiological Exams on  Admission: Dg Chest 2 View  12/26/2014   CLINICAL DATA:  Chest pain or shortness of breath and syncope  EXAM: CHEST  2 VIEW  COMPARISON:  07/24/2013  FINDINGS: Left subclavian 2 lead pacer noted. Normal heart size and vascularity. No focal pneumonia, collapse or consolidation. Negative for edema, effusion or pneumothorax. Trachea midline. Prior cholecystectomy noted.  IMPRESSION: Stable chest exam.  No acute process.   Electronically Signed   By: Judie Petit.  Shick M.D.   On: 12/26/2014 14:10   Ct Abdomen Pelvis W Contrast  12/26/2014   CLINICAL DATA:  RIGHT lower quadrant pain, stabbing pain radiating down RIGHT leg, history coronary disease, diastolic dysfunction, pacemaker, stroke, former smoker  EXAM: CT ABDOMEN AND PELVIS WITH CONTRAST  TECHNIQUE: Multidetector CT imaging of the abdomen and pelvis was performed using the standard protocol following bolus administration of intravenous contrast. Sagittal and coronal MPR images reconstructed from axial data set.  CONTRAST:  OMNIPAQUE IOHEXOL 300 MG/ML SOLN IV. Dilute oral contrast.  COMPARISON:  None  FINDINGS: Dependent atelectasis in both lower lobes.  Pacemaker leads RIGHT atrium and RIGHT ventricle.  Gallbladder surgically absent.  Liver, spleen, pancreas, kidneys, and adrenal glands normal.  Normal appendix.  Sigmoid diverticulosis without evidence of diverticulitis.  Stomach and bowel loops otherwise normal appearance.  Bladder and ureters normal.  No mass, adenopathy, free air, free fluid or hernia.  Spinal stenosis and L4-L5 primarily due to broad-based posterior disc herniation, flattening thecal sac and question exerting mass effect upon the L5 roots at the lateral recesses.  No definite compression of the exiting L4 roots.  Mildly bulging discs at L3-L4 and L5-S1  IMPRESSION: Sigmoid diverticulosis without evidence of diverticulitis.  Spinal stenosis L4-L5.  Broad-based disc herniation L4-L5 with question mass effect upon the BILATERAL L5 roots at the  lateral recesses.   Electronically Signed   By: Ulyses Southward M.D.   On: 12/26/2014 15:14    EKG: Independently reviewed. Atrial Paced.  No prolonged QT  Time spent: 45 min.  Conley Canal [facility] Pager [phone]  If 7PM-7AM, please contact night-coverage [email or web address] Password [facility] 12/26/2014, 5:45 PM

## 2014-12-26 NOTE — ED Notes (Addendum)
[facility], residual is numbness/tingling right arm and right leg.  Onset 10am pt reports LOC x 2, lasting 4 min, no injuries, friend assisted pt down.  Substernal chest pain radiating underneath right breast and shortness of breath.  Pt also has diaphoresis.

## 2014-12-26 NOTE — ED Notes (Addendum)
Pt is reporting right side stabbing abdominal pain [facility].  Pt reports worsening today. The pain goes down his right leg which was "feeling better until today."  Chest pressure has also been intermittent since his hospitalization.

## 2014-12-26 NOTE — ED Provider Notes (Addendum)
CSN: [id number]     Arrival date & time 12/26/14  1155 History   First MD Initiated Contact with Patient 12/26/14 1209     Chief Complaint  Patient presents with  . Chest Pain  . Abdominal Pain     (Consider location/radiation/quality/duration/timing/severity/associated sxs/prior Treatment) Patient is a 64 y.o. male presenting with chest pain and abdominal pain. The history is provided by the patient.  Chest Pain Pain location:  Substernal area Pain quality: pressure   Pain radiates to:  Does not radiate Pain radiates to the back: no   Pain severity:  Moderate Onset quality:  Sudden Duration:  4 hours Timing:  Constant Progression:  Unchanged Chronicity:  Recurrent Context: at rest   Relieved by:  Nothing Associated symptoms: no abdominal pain, no cough, no fever, no shortness of breath and not vomiting   Abdominal Pain Associated symptoms: chest pain (chest heaviness, nonradiating, began about 2.5 hours ago)   Associated symptoms: no cough, no fever, no shortness of breath and no vomiting     Past Medical History  Diagnosis Date  . Coronary artery disease   . Pacemaker   . Diastolic dysfunction   . Stroke    Past Surgical History  Procedure Laterality Date  . Pacemaker insertion    . Cardiac stents times 2    . Cholecystectomy    . Left heart catheterization with coronary angiogram N/A 07/25/2013    Procedure: LEFT HEART CATHETERIZATION WITH CORONARY ANGIOGRAM;  Surgeon: Laurey Morale, MD;  Location: [facility] CATH LAB;  Service: Cardiovascular;  Laterality: N/A;   No family history on file. History  Substance Use Topics  . Smoking status: Former Games developer  . Smokeless tobacco: Not on file  . Alcohol Use: No    Review of Systems  Constitutional: Negative for fever.  Respiratory: Negative for cough and shortness of breath.   Cardiovascular: Positive for chest pain (chest heaviness, nonradiating, began about 2.5 hours ago).  Gastrointestinal: Negative for vomiting and  abdominal pain.  Neurological: Positive for syncope (twice today).  All other systems reviewed and are negative.     Allergies  Review of patient's allergies indicates no known allergies.  Home Medications   Prior to Admission medications   Medication Sig Start Date End Date Taking? Authorizing Provider  acetaminophen (TYLENOL) 500 MG tablet Take 1,000 mg by mouth daily as needed for pain.    Historical Provider, MD  aspirin EC 81 MG tablet Take 81 mg by mouth daily.    Historical Provider, MD  atorvastatin (LIPITOR) 80 MG tablet Take 1 tablet (80 mg total) by mouth daily at 6 PM. 07/26/13   Annett Gula, MD  metoprolol tartrate (LOPRESSOR) 12.5 mg TABS tablet Take 0.5 tablets (12.5 mg total) by mouth 2 (two) times daily. 07/26/13   Annett Gula, MD  Multiple Vitamin (MULTIVITAMIN WITH MINERALS) TABS Take 1 tablet by mouth daily.    Historical Provider, MD   BP 131/80 mmHg  Pulse 76  Temp(Src) 98.9 F (37.2 C) (Oral)  Resp 14  Ht  (1.803 m)  Wt 168 lb (76.204 kg)  BMI 23.44 kg/m2  SpO2 99% Physical Exam  Constitutional: He is oriented to person, place, and time. He appears well-developed and well-nourished. No distress.  HENT:  Head: Normocephalic and atraumatic.  Mouth/Throat: No oropharyngeal exudate.  Eyes: EOM are normal. Pupils are equal, round, and reactive to light.  Neck: Normal range of motion. Neck supple.  Cardiovascular: Normal rate and regular rhythm.  Exam reveals no friction rub.   No murmur heard. Pulmonary/Chest: Effort normal and breath sounds normal. No respiratory distress. He has no wheezes. He has no rales.  Abdominal: He exhibits no distension. There is no tenderness. There is no rebound.  Musculoskeletal: Normal range of motion. He exhibits no edema.  Neurological: He is alert and oriented to person, place, and time. A sensory deficit (mild R leg altered sensation) is present. No cranial nerve deficit. GCS eye subscore is 4. GCS verbal subscore  is 5. GCS motor subscore is 6.  Skin: He is not diaphoretic.  Nursing note reviewed.   ED Course  Procedures (including critical care time) Labs Review Labs Reviewed  CBC  BASIC METABOLIC PANEL  BRAIN NATRIURETIC PEPTIDE  I-STAT TROPOININ, ED    Imaging Review Dg Chest 2 View  12/26/2014   CLINICAL DATA:  Chest pain or shortness of breath and syncope  EXAM: CHEST  2 VIEW  COMPARISON:  07/24/2013  FINDINGS: Left subclavian 2 lead pacer noted. Normal heart size and vascularity. No focal pneumonia, collapse or consolidation. Negative for edema, effusion or pneumothorax. Trachea midline. Prior cholecystectomy noted.  IMPRESSION: Stable chest exam.  No acute process.   Electronically Signed   By: Judie PetitM.  Shick M.D.   On: 12/26/2014 14:10   Ct Abdomen Pelvis W Contrast  12/26/2014   CLINICAL DATA:  RIGHT lower quadrant pain, stabbing pain radiating down RIGHT leg, history coronary disease, diastolic dysfunction, pacemaker, stroke, former smoker  EXAM: CT ABDOMEN AND PELVIS WITH CONTRAST  TECHNIQUE: Multidetector CT imaging of the abdomen and pelvis was performed using the standard protocol following bolus administration of intravenous contrast. Sagittal and coronal MPR images reconstructed from axial data set.  CONTRAST:  100mL OMNIPAQUE IOHEXOL 300 MG/ML SOLN IV. Dilute oral contrast.  COMPARISON:  None  FINDINGS: Dependent atelectasis in both lower lobes.  Pacemaker leads RIGHT atrium and RIGHT ventricle.  Gallbladder surgically absent.  Liver, spleen, pancreas, kidneys, and adrenal glands normal.  Normal appendix.  Sigmoid diverticulosis without evidence of diverticulitis.  Stomach and bowel loops otherwise normal appearance.  Bladder and ureters normal.  No mass, adenopathy, free air, free fluid or hernia.  Spinal stenosis and L4-L5 primarily due to broad-based posterior disc herniation, flattening thecal sac and question exerting mass effect upon the L5 roots at the lateral recesses.  No definite  compression of the exiting L4 roots.  Mildly bulging discs at L3-L4 and L5-S1  IMPRESSION: Sigmoid diverticulosis without evidence of diverticulitis.  Spinal stenosis L4-L5.  Broad-based disc herniation L4-L5 with question mass effect upon the BILATERAL L5 roots at the lateral recesses.   Electronically Signed   By: Ulyses SouthwardMark  Boles M.D.   On: 12/26/2014 15:14     EKG Interpretation   Date/Time:  Saturday December 26 2014 12:02:13 EDT Ventricular Rate:  80 PR Interval:  166 QRS Duration: 76 QT Interval:  352 QTC Calculation: 405 R Axis:   30 Text Interpretation:  Atrial-paced rhythm Abnormal ECG No significant  change since last tracing Confirmed by Gwendolyn GrantWALDEN  MD, Reichen Hutzler ([id number]) on  12/26/2014 12:10:53 PM      MDM   Final diagnoses:  Right lower quadrant pain  Diverticulosis of intestine without bleeding, unspecified intestinal tract location  Chest pain, unspecified chest pain type  Headache    L here with right-sided abdominal pain. Began [facility] He received morphine there. It went away but then recurred this morning. Also at that time when it  recurred this morning he started having some chest heaviness that has been constant since 10 AM. He also states he passed out twice. He has history of recurrent syncope due to bradycardia and he has a pacemaker for this. He has history of stroke with residual R sided weakness. At [facility] earlier in the week he had tPA for his presumed R sided weakness. He came in at that time with syncope, aphasia, and R sided weakness. Here well-appearing, mild R leg numbness on exam. RLQ pain on exam. Will CT his abdomen, check troponins. EKG similar to prior. CT shows diverticulosis without diverticulitis. Patient developed a headache after receiving pain medication. I asked the hospitalist to see the patient for his chest pain. Dr. Gwenlyn Perking evaluated the patient and doesn't feel he requries admission for his chest pain. Patient spoke to  Dr. Gwenlyn Perking and was concerned his aphasia and R leg weakness was worse today. This was different from what the patient told me earlier in the day. Will speak with the Neurologist at [facility] I spoke with Dr. Thurman Coyer at [facility]. She remembered him from prior. She stated with no new deficits, no need for workup. She stated to encourage him to continue taking his gabapentin and aspirin to help with his residual deficits. I confirmed with the patient that he has had these deficits previously and this is not a new thing. He confirmed he's had these deficits at multiple times in the past several months. Will obtain CT Head and 2nd troponin. Anticipate discharge. HA improving with tylenol.  Elwin Mocha, MD 12/26/14 [phone]

## 2014-12-26 NOTE — ED Provider Notes (Signed)
Assumed care from Dr. Gwendolyn GrantWalden.  Briefly, pt is a 64 y.o. male with recent CVA sp TPA 5 days ago presenting with recurrent syncope, chest pain.  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 He states he does not normally have chest pain, describes this chest pain as left-sided chest pressure, nonexertional, without associated dyspnea. On my subsequent care the patient had received a significant workup including CT scan of the abdomen, chest x-ray, CT scan of the head, delta troponin. Workup unremarkable. I interrogated the patient's pacer which was without acute event. I had a detailed discussion with the patient regarding the etiology of his syncopal which was chronic, as well as his chest pain, which given that the patient had an atypical history and negative delta troponin was unlikely to represent ACS or cardiogenic syncope. I suspect his symptoms are all resolving CVA sequelae. Dr. Gwendolyn GrantWalden [facility] who agree with plan to discharge home. Patient discharged home to follow-up with cardiology in stable condition.  Jacob MoMatthew Gentry, MD 12/26/14 2122

## 2015-03-01 ENCOUNTER — Inpatient Hospital Stay
Admit: 2015-03-01 | Discharge: 2015-03-02 | Disposition: A | Discharge status: Home / Self Care | Payer: MEDICARE | Attending: Emergency Medicine

## 2015-03-01 ENCOUNTER — Emergency Department: Admit: 2015-03-01 | Payer: MEDICARE

## 2015-03-01 DIAGNOSIS — R55 Syncope and collapse: Secondary | ICD-10-CM

## 2015-03-01 LAB — METABOLIC PANEL, COMPREHENSIVE
A-G Ratio: 1 — ABNORMAL LOW (ref 1.2–3.5)
ALT (SGPT): 16 U/L (ref 12–65)
AST (SGOT): 17 U/L (ref 15–37)
Albumin: 3.5 g/dL (ref 3.2–4.6)
Alk. phosphatase: 68 U/L (ref 50–136)
Anion gap: 8 mmol/L (ref 7–16)
BUN: 11 MG/DL (ref 8–23)
Bilirubin, total: 0.4 MG/DL (ref 0.2–1.1)
CO2: 23 mmol/L (ref 21–32)
Calcium: 8.5 MG/DL (ref 8.3–10.4)
Chloride: 109 mmol/L — ABNORMAL HIGH (ref 98–107)
Creatinine: 1.08 MG/DL (ref 0.8–1.5)
GFR est AA: >60 mL/min/{1.73_m2} (ref >60)
GFR est non-AA: >60 mL/min/{1.73_m2} (ref >60)
Globulin: 3.5 g/dL (ref 2.3–3.5)
Glucose: 95 mg/dL (ref 65–100)
Potassium: 3.9 mmol/L (ref 3.5–5.1)
Protein, total: 7 g/dL (ref 6.3–8.2)
Sodium: 140 mmol/L (ref 136–145)

## 2015-03-01 LAB — CBC WITH AUTOMATED DIFF
ABS. BASOPHILS: 0 10*3/uL (ref 0.0–0.2)
ABS. EOSINOPHILS: 0.1 10*3/uL (ref 0.0–0.8)
ABS. IMM. GRANS.: 0 10*3/uL (ref 0.0–0.5)
ABS. LYMPHOCYTES: 1.5 10*3/uL (ref 0.5–4.6)
ABS. MONOCYTES: 0.6 10*3/uL (ref 0.1–1.3)
ABS. NEUTROPHILS: 4.2 10*3/uL (ref 1.7–8.2)
BASOPHILS: 1 % (ref 0.0–2.0)
EOSINOPHILS: 2 % (ref 0.5–7.8)
HCT: 40.3 % — ABNORMAL LOW (ref 41.1–50.3)
HGB: 13 g/dL — ABNORMAL LOW (ref 13.6–17.2)
IMMATURE GRANULOCYTES: 0.2 % (ref 0.0–5.0)
LYMPHOCYTES: 23 % (ref 13–44)
MCH: 30.4 PG (ref 26.1–32.9)
MCHC: 32.3 g/dL (ref 31.4–35.0)
MCV: 94.2 FL (ref 79.6–97.8)
MONOCYTES: 9 % (ref 4.0–12.0)
MPV: 11.1 FL (ref 10.8–14.1)
NEUTROPHILS: 65 % (ref 43–78)
PLATELET: 169 10*3/uL (ref 150–450)
RBC: 4.28 M/uL (ref 4.23–5.67)
RDW: 14 % (ref 11.9–14.6)
WBC: 6.5 10*3/uL (ref 4.3–11.1)

## 2015-03-01 LAB — TROPONIN I: Troponin-I, Qt.: <0.02 NG/ML — ABNORMAL LOW (ref 0.02–0.05)

## 2015-03-01 NOTE — ED Notes (Signed)
I have reviewed discharge instructions with the patient.  The patient verbalized understanding. Discharge medications reviewed with patient and appropriate educational materials and side effects teaching were provided. Patient ambulatory to waiting room, states not driving

## 2015-03-01 NOTE — ED Notes (Signed)
Patient to ed via pov with c/o chest pain and syncope--patient reports he was walking towards the kitchen when he had syncopal episode--patient reports chest pain to middle of chest and reports pain radiates to right side--patient denies vomiting--patient reports shortness of breath--patient is awake, alert and oriented x4 at this time--patient denies hitting his head stating he fell on the couch

## 2015-03-01 NOTE — ED Provider Notes (Signed)
HPI Comments: Patient with an atrial pacemaker presents to the er  For chest pain and syncope x 2. His pain is improving after receiving ntg however it is returning now. Pain is midsternal non radiating, 3/10 severity, no other associated signs or symptoms.     Patient is a 64 y.o. male presenting with chest pain. The history is provided by the patient.   Chest Pain (Angina)   This is a new problem. The current episode started 3 to 5 hours ago. The problem has been gradually worsening. The problem occurs constantly. The pain is at a severity of 3/10. The pain is mild. The quality of the pain is described as pressure-like. The pain does not radiate. The symptoms are aggravated by movement and stress. Associated symptoms include dizziness and near-syncope. Pertinent negatives include no abdominal pain, no back pain, no claudication, no diaphoresis, no exertional chest pressure, no fever, no headaches, no hemoptysis, no leg pain, no malaise/fatigue, no nausea, no numbness, no orthopnea, no palpitations, no PND, no shortness of breath, no vomiting and no weakness. He has tried nitroglycerin for the symptoms. The treatment provided mild relief. Risk factors include smoking/tobacco exposure, family history, cardiac disease, stress, hypertension and male gender. His past medical history is significant for HTN.His past medical history does not include aneurysm, cancer, DM, DVT or PE. Procedural history includes cardiac catheterization, echocardiogram and cardiac stents.       Past Medical History:   Diagnosis Date   ??? CAD (coronary artery disease)        Past Surgical History:   Procedure Laterality Date   ??? Hx pacemaker     ??? Hx cholecystectomy           History reviewed. No pertinent family history.    History     Social History   ??? Marital Status: SINGLE     Spouse Name: N/A   ??? Number of Children: N/A   ??? Years of Education: N/A     Occupational History   ??? Not on file.     Social History Main Topics    ??? Smoking status: Never Smoker    ??? Smokeless tobacco: Never Used   ??? Alcohol Use: No   ??? Drug Use: No   ??? Sexual Activity: Not on file     Other Topics Concern   ??? Not on file     Social History Narrative         ALLERGIES: Review of patient's allergies indicates no known allergies.    Review of Systems   Constitutional: Negative.  Negative for fever, malaise/fatigue and diaphoresis.   Eyes: Negative.    Respiratory: Negative.  Negative for hemoptysis and shortness of breath.    Cardiovascular: Positive for chest pain and near-syncope. Negative for palpitations, orthopnea, claudication and PND.   Gastrointestinal: Negative.  Negative for nausea, vomiting and abdominal pain.   Endocrine: Negative.    Genitourinary: Negative.    Musculoskeletal: Negative for back pain.   Skin: Negative.    Allergic/Immunologic: Negative.    Neurological: Positive for dizziness. Negative for weakness, numbness and headaches.   Psychiatric/Behavioral: Negative.    All other systems reviewed and are negative.      Filed Vitals:    03/01/15 1904   BP: 134/79   Pulse: 83   Temp: 98.1 ??F (36.7 ??C)   Resp: 18   Height: 5\' 11"  (1.803 m)   Weight: 77.565 kg (171 lb)   SpO2: 97%  Physical Exam   Constitutional: He is oriented to person, place, and time. He appears well-developed and well-nourished. No distress.   HENT:   Head: Normocephalic and atraumatic.   Eyes: Conjunctivae and EOM are normal. Pupils are equal, round, and reactive to light.   Neck: Normal range of motion. Neck supple.   Cardiovascular: Normal rate and regular rhythm.    Pulmonary/Chest: Effort normal and breath sounds normal.   Abdominal: Soft. Bowel sounds are normal.   Musculoskeletal: Normal range of motion.   Neurological: He is alert and oriented to person, place, and time. He has normal reflexes.   Skin: Skin is warm and dry.   Psychiatric: He has a normal mood and affect.   Nursing note and vitals reviewed.       MDM   Number of Diagnoses or Management Options  Chest pain, unspecified type: new and requires workup  Syncope and collapse: new and requires workup  Diagnosis management comments: ecg shows sinus at 80 bpm , paced, qtc 435 ms normal axis  Discussed case and lab results with cardiololgy   Will obtain repeat troponin and medtronic pacer interrogation here in the ER  Was seen by Dr Danella Maiers from cardiology in the ER, since his pacer interrogation was negative, and he has had 2 troponins which were not elevated he would like to have him discharged to home with cardiology follow up       Amount and/or Complexity of Data Reviewed  Clinical lab tests: ordered and reviewed  Tests in the radiology section of CPT??: ordered and reviewed    Patient Progress  Patient progress: stable      Procedures

## 2015-03-01 NOTE — ED Notes (Signed)
Difficult IV access, continuing to attempt to draw repeat troponin. Oswaldo Milian, MD notified.

## 2015-03-02 LAB — EKG, 12 LEAD, INITIAL
Atrial Rate: 80 {beats}/min
Calculated P Axis: 21 degrees
Calculated R Axis: 43 degrees
Calculated T Axis: 51 degrees
P-R Interval: 102 ms
Q-T Interval: 378 ms
QRS Duration: 84 ms
QTC Calculation (Bezet): 435 ms
Ventricular Rate: 80 {beats}/min

## 2015-03-02 LAB — POC TROPONIN: Troponin-I (POC): 0.01 ng/ml (ref 0.0–0.08)

## 2015-03-02 MED ORDER — HYDROCODONE-ACETAMINOPHEN 5 MG-325 MG TAB
5-325 mg | Freq: Once | ORAL | Status: AC
Start: 2015-03-02 — End: 2015-03-01
  Administered 2015-03-02: 03:00:00 via ORAL

## 2015-03-02 MED FILL — HYDROCODONE-ACETAMINOPHEN 5 MG-325 MG TAB: 5-325 mg | ORAL | Qty: 1

## 2015-12-13 ENCOUNTER — Emergency Department (HOSPITAL_COMMUNITY): Payer: Medicare Other

## 2015-12-13 ENCOUNTER — Encounter (HOSPITAL_COMMUNITY): Payer: Self-pay | Admitting: Emergency Medicine

## 2015-12-13 ENCOUNTER — Inpatient Hospital Stay (HOSPITAL_COMMUNITY)
Admission: EM | Admit: 2015-12-13 | Discharge: 2015-12-17 | DRG: 882 | Disposition: A | Discharge status: Skilled Nursing Facility | Payer: Medicare Other | Attending: Neurology | Admitting: Neurology

## 2015-12-13 DIAGNOSIS — G459 Transient cerebral ischemic attack, unspecified: Secondary | ICD-10-CM | POA: Diagnosis not present

## 2015-12-13 DIAGNOSIS — I110 Hypertensive heart disease with heart failure: Secondary | ICD-10-CM | POA: Diagnosis present

## 2015-12-13 DIAGNOSIS — I639 Cerebral infarction, unspecified: Secondary | ICD-10-CM

## 2015-12-13 DIAGNOSIS — Z950 Presence of cardiac pacemaker: Secondary | ICD-10-CM

## 2015-12-13 DIAGNOSIS — F4323 Adjustment disorder with mixed anxiety and depressed mood: Secondary | ICD-10-CM | POA: Diagnosis not present

## 2015-12-13 DIAGNOSIS — R55 Syncope and collapse: Secondary | ICD-10-CM | POA: Diagnosis not present

## 2015-12-13 DIAGNOSIS — Z955 Presence of coronary angioplasty implant and graft: Secondary | ICD-10-CM

## 2015-12-13 DIAGNOSIS — Z87891 Personal history of nicotine dependence: Secondary | ICD-10-CM

## 2015-12-13 DIAGNOSIS — G8194 Hemiplegia, unspecified affecting left nondominant side: Secondary | ICD-10-CM | POA: Diagnosis present

## 2015-12-13 DIAGNOSIS — I6310 Cerebral infarction due to embolism of unspecified precerebral artery: Secondary | ICD-10-CM

## 2015-12-13 DIAGNOSIS — H5347 Heteronymous bilateral field defects: Secondary | ICD-10-CM | POA: Diagnosis present

## 2015-12-13 DIAGNOSIS — I5032 Chronic diastolic (congestive) heart failure: Secondary | ICD-10-CM | POA: Diagnosis present

## 2015-12-13 DIAGNOSIS — R2990 Unspecified symptoms and signs involving the nervous system: Secondary | ICD-10-CM | POA: Diagnosis present

## 2015-12-13 DIAGNOSIS — R29705 NIHSS score 5: Secondary | ICD-10-CM | POA: Diagnosis present

## 2015-12-13 DIAGNOSIS — E785 Hyperlipidemia, unspecified: Secondary | ICD-10-CM | POA: Diagnosis present

## 2015-12-13 DIAGNOSIS — I2510 Atherosclerotic heart disease of native coronary artery without angina pectoris: Secondary | ICD-10-CM | POA: Diagnosis present

## 2015-12-13 DIAGNOSIS — F449 Dissociative and conversion disorder, unspecified: Secondary | ICD-10-CM | POA: Diagnosis present

## 2015-12-13 DIAGNOSIS — Z8673 Personal history of transient ischemic attack (TIA), and cerebral infarction without residual deficits: Secondary | ICD-10-CM

## 2015-12-13 DIAGNOSIS — Z79899 Other long term (current) drug therapy: Secondary | ICD-10-CM

## 2015-12-13 LAB — RAPID URINE DRUG SCREEN, HOSP PERFORMED
AMPHETAMINES: NOT DETECTED
BARBITURATES: NOT DETECTED
BENZODIAZEPINES: NOT DETECTED
COCAINE: NOT DETECTED
Opiates: NOT DETECTED
TETRAHYDROCANNABINOL: NOT DETECTED

## 2015-12-13 LAB — I-STAT CHEM 8, ED
BUN: 13 mg/dL (ref 6–20)
Calcium, Ion: 1.18 mmol/L (ref 1.13–1.30)
Chloride: 104 mmol/L (ref 101–111)
Creatinine, Ser: 1 mg/dL (ref 0.61–1.24)
Glucose, Bld: 98 mg/dL (ref 65–99)
HEMATOCRIT: 43 % (ref 39.0–52.0)
Hemoglobin: 14.6 g/dL (ref 13.0–17.0)
Potassium: 3.9 mmol/L (ref 3.5–5.1)
Sodium: 142 mmol/L (ref 135–145)
TCO2: 25 mmol/L (ref 0–100)

## 2015-12-13 LAB — CBC
HCT: 40.8 % (ref 39.0–52.0)
Hemoglobin: 13.3 g/dL (ref 13.0–17.0)
MCH: 30 pg (ref 26.0–34.0)
MCHC: 32.6 g/dL (ref 30.0–36.0)
MCV: 91.9 fL (ref 78.0–100.0)
PLATELETS: 202 10*3/uL (ref 150–400)
RBC: 4.44 MIL/uL (ref 4.22–5.81)
RDW: 14.3 % (ref 11.5–15.5)
WBC: 7.4 10*3/uL (ref 4.0–10.5)

## 2015-12-13 LAB — BASIC METABOLIC PANEL
Anion gap: 7 (ref 5–15)
BUN: 12 mg/dL (ref 6–20)
CHLORIDE: 107 mmol/L (ref 101–111)
CO2: 22 mmol/L (ref 22–32)
CREATININE: 1.11 mg/dL (ref 0.61–1.24)
Calcium: 9.2 mg/dL (ref 8.9–10.3)
GFR calc Af Amer: >60 mL/min (ref >60)
GFR calc non Af Amer: >60 mL/min (ref >60)
GLUCOSE: 100 mg/dL — AB (ref 65–99)
Potassium: 4.7 mmol/L (ref 3.5–5.1)
SODIUM: 136 mmol/L (ref 135–145)

## 2015-12-13 LAB — URINE MICROSCOPIC-ADD ON: Bacteria, UA: NONE SEEN

## 2015-12-13 LAB — URINALYSIS, ROUTINE W REFLEX MICROSCOPIC
BILIRUBIN URINE: NEGATIVE
Glucose, UA: NEGATIVE mg/dL
KETONES UR: NEGATIVE mg/dL
LEUKOCYTES UA: NEGATIVE
Nitrite: NEGATIVE
PH: 6.5 (ref 5.0–8.0)
PROTEIN: NEGATIVE mg/dL
Specific Gravity, Urine: 1.02 (ref 1.005–1.030)

## 2015-12-13 LAB — ETHANOL: Alcohol, Ethyl (B): <5 mg/dL (ref <5)

## 2015-12-13 LAB — DIFFERENTIAL
BASOS ABS: 0 10*3/uL (ref 0.0–0.1)
Basophils Relative: 0 %
EOS ABS: 0.1 10*3/uL (ref 0.0–0.7)
Eosinophils Relative: 1 %
LYMPHS ABS: 1.8 10*3/uL (ref 0.7–4.0)
LYMPHS PCT: 24 %
Monocytes Absolute: 0.9 10*3/uL (ref 0.1–1.0)
Monocytes Relative: 13 %
NEUTROS ABS: 4.5 10*3/uL (ref 1.7–7.7)
NEUTROS PCT: 62 %

## 2015-12-13 LAB — CBG MONITORING, ED: Glucose-Capillary: 98 mg/dL (ref 65–99)

## 2015-12-13 MED ORDER — NITROGLYCERIN 0.4 MG SL SUBL: 0.4000 mg | SUBLINGUAL_TABLET | SUBLINGUAL | Status: DC | PRN

## 2015-12-13 MED ORDER — IOPAMIDOL (ISOVUE-370) INJECTION 76%
INTRAVENOUS | Status: AC
  Administered 2015-12-13: 50 mL
  Filled 2015-12-13: qty 50

## 2015-12-13 MED ORDER — ALTEPLASE (STROKE) FULL DOSE INFUSION
0.9000 mg/kg | Freq: Once | INTRAVENOUS | Status: AC
  Administered 2015-12-13: 76 mg via INTRAVENOUS
  Filled 2015-12-13: qty 100

## 2015-12-13 MED ORDER — IOPAMIDOL (ISOVUE-370) INJECTION 76%
100.0000 mL | Freq: Once | INTRAVENOUS | Status: AC | PRN
  Administered 2015-12-13: 100 mL via INTRAVENOUS

## 2015-12-13 NOTE — ED Notes (Addendum)
Pt c/o left visual field deficit and left sided weakness. Last stated well at 1700

## 2015-12-13 NOTE — ED Notes (Signed)
According to x-ray tech, she was able to get one x-ray standing up and the pt got dizzy and about passed out.  Pt transferred to [facility] A

## 2015-12-13 NOTE — Progress Notes (Signed)
Code Stroke called on 65 y.o male, history hypertension, hyperlipidemia, coronary artery disease, bradycardia with pacemaker placement, and previous stroke, presenting with recurrent syncopal spells as well as loss of vision on the left side and numbness involving left face arm and leg, and mild slurred speech. Patient reports that he was at home at rest when he felt sudden onset dizziness and diaphoresis. When he woke up had chest pain and felt very weak on his left side. [facility] initial CT scan for further management. CT reviewed per Neurologist Dr. Roseanne RenoStewart as negative for acute abnormality.  CTA of chest also completed, resulting as unremarkable.  NIH stroke score was 6. CBG 98. Patient was deemed a candidate for TPA, which was administered at 2125. Patient to be admitted to neuro intensive care unit for further management.

## 2015-12-13 NOTE — ED Notes (Signed)
According to

## 2015-12-13 NOTE — H&P (Signed)
Admission H&P    Chief Complaint: Recurrent syncope with left visual field defect and left-sided numbness.  HPI: Jacob Pineda is an 65 y.o. male history of hypertension, hyperlipidemia, coronary artery disease, bradycardia with pacemaker placement, and previous stroke, presenting with recurrent syncopal spells as well as loss of vision on the left side and numbness involving left face arm and leg. Code stroke was activated at [facility] ED. Patient was subsequently transferred to [facility] for further management. He reportedly experienced 2 episodes of syncope at [facility] ED. CT scan of his head showed no acute intracranial abnormality. He complained of chest pain and also underwent CT angiogram of his chest which was unremarkable. NIH stroke score was 5. Patient was deemed a candidate for intravenous thrombolytic therapy with TPA, which was administered. Patient was subsequently admitted to neuro intensive care unit for further management.  LSN: 5:00 PM on 12/13/2015 tPA Given: Yes mRankin:  Past Medical History  Diagnosis Date  . Coronary artery disease   . Pacemaker   . Diastolic dysfunction   . Stroke [facility])     Past Surgical History  Procedure Laterality Date  . Pacemaker insertion    . Cardiac stents times 2    . Cholecystectomy    . Left heart catheterization with coronary angiogram N/A 07/25/2013    Procedure: LEFT HEART CATHETERIZATION WITH CORONARY ANGIOGRAM;  Surgeon: Larey Dresser, MD;  Location: [facility] CATH LAB;  Service: Cardiovascular;  Laterality: N/A;    Family history: Obtained and was noncontributory.  Social History:  reports that he has quit smoking. He does not have any smokeless tobacco history on file. He reports that he does not drink alcohol or use illicit drugs.  Allergies: No Known Allergies  Medications: Preadmission medications were reviewed by me.  ROS: History obtained from the patient  General ROS: negative for - chills, fatigue,  fever, night sweats, weight gain or weight loss Psychological ROS: negative for - behavioral disorder, hallucinations, memory difficulties, mood swings or suicidal ideation Ophthalmic ROS: negative for - blurry vision, double vision, eye pain or loss of vision ENT ROS: negative for - epistaxis, nasal discharge, oral lesions, sore throat, tinnitus or vertigo Allergy and Immunology ROS: negative for - hives or itchy/watery eyes Hematological and Lymphatic ROS: negative for - bleeding problems, bruising or swollen lymph nodes Endocrine ROS: negative for - galactorrhea, hair pattern changes, polydipsia/polyuria or temperature intolerance Respiratory ROS: negative for - cough, hemoptysis, shortness of breath or wheezing Cardiovascular ROS: negative for - chest pain, dyspnea on exertion, edema or irregular heartbeat Gastrointestinal ROS: negative for - abdominal pain, diarrhea, hematemesis, nausea/vomiting or stool incontinence Genito-Urinary ROS: negative for - dysuria, hematuria, incontinence or urinary frequency/urgency Musculoskeletal ROS: negative for - joint swelling or muscular weakness Neurological ROS: as noted in HPI Dermatological ROS: negative for rash and skin lesion changes  Physical Examination: Blood pressure 151/88, pulse 61, temperature 98.5 F (36.9 C), temperature source Oral, resp. rate 11, height '5\' 11"'$  (1.803 m), weight 83.915 kg (185 lb), SpO2 98 %.  HEENT-  Normocephalic, no lesions, without obvious abnormality.  Normal external eye and conjunctiva.  Normal TM's bilaterally.  Normal auditory canals and external ears. Normal external nose, mucus membranes and septum.  Normal pharynx. Neck supple with no masses, nodes, nodules or enlargement. Cardiovascular - regular rate and rhythm, S1, S2 normal, no murmur, click, rub or gallop Lungs - chest clear, no wheezing, rales, normal symmetric air entry Abdomen - soft, non-tender; bowel sounds normal; no  masses,  no  organomegaly Extremities - no joint deformities, effusion, or inflammation and no edema  Neurologic Examination: Mental Status: Alert, oriented, thought content appropriate.  Speech fluent without evidence of aphasia. Able to follow commands without difficulty. Cranial Nerves: II-left homonymous hemianopsia. III/IV/VI-Pupils were equal and reacted normally to light. Extraocular movements were full and conjugate.    V/VII-reduced perception of tactile sensation over left side of the face compared to the right; no facial weakness. VIII-normal. X-normal speech, edentulous; symmetrical palatal movement. XI: trapezius strength/neck flexion strength normal bilaterally XII-midline tongue extension with normal strength. Motor: Mild drift of left lower extremities; motor exam otherwise unremarkable Sensory: Reduced perception of tactile sensation over extremities on the left compared to the right. Deep Tendon Reflexes: Trace to 1+ and symmetric. Plantars: Flexor bilaterally Cerebellar: Normal finger-to-nose testing. Carotid auscultation: Normal  Results for orders placed or performed during the hospital encounter of 12/13/15 (from the past 48 hour(s))  CBG monitoring, ED     Status: None   Collection Time: 12/13/15  7:22 PM  Result Value Ref Range   Glucose-Capillary 98 65 - 99 mg/dL  Basic metabolic panel     Status: Abnormal   Collection Time: 12/13/15  8:01 PM  Result Value Ref Range   Sodium 136 135 - 145 mmol/L   Potassium 4.7 3.5 - 5.1 mmol/L   Chloride 107 101 - 111 mmol/L   CO2 22 22 - 32 mmol/L   Glucose, Bld 100 (H) 65 - 99 mg/dL   BUN 12 6 - 20 mg/dL   Creatinine, Ser 1.11 0.61 - 1.24 mg/dL   Calcium 9.2 8.9 - 10.3 mg/dL   GFR calc non Af Amer >60 >60 mL/min   GFR calc Af Amer >60 >60 mL/min    Comment: (NOTE) The eGFR has been calculated using the CKD EPI equation. This calculation has not been validated in all clinical situations. eGFR's persistently <60 mL/min signify  possible Chronic Kidney Disease.    Anion gap 7 5 - 15  CBC     Status: None   Collection Time: 12/13/15  8:01 PM  Result Value Ref Range   WBC 7.4 4.0 - 10.5 K/uL   RBC 4.44 4.22 - 5.81 MIL/uL   Hemoglobin 13.3 13.0 - 17.0 g/dL   HCT 40.8 39.0 - 52.0 %   MCV 91.9 78.0 - 100.0 fL   MCH 30.0 26.0 - 34.0 pg   MCHC 32.6 30.0 - 36.0 g/dL   RDW 14.3 11.5 - 15.5 %   Platelets 202 150 - 400 K/uL  Ethanol     Status: None   Collection Time: 12/13/15  8:01 PM  Result Value Ref Range   Alcohol, Ethyl (B) <5 <5 mg/dL  Differential     Status: None   Collection Time: 12/13/15  8:01 PM  Result Value Ref Range   Neutrophils Relative % 62 %   Neutro Abs 4.5 1.7 - 7.7 K/uL   Lymphocytes Relative 24 %   Lymphs Abs 1.8 0.7 - 4.0 K/uL   Monocytes Relative 13 %   Monocytes Absolute 0.9 0.1 - 1.0 K/uL   Eosinophils Relative 1 %   Eosinophils Absolute 0.1 0.0 - 0.7 K/uL   Basophils Relative 0 %   Basophils Absolute 0.0 0.0 - 0.1 K/uL  I-Stat Chem 8, ED  (not at [facility])     Status: None   Collection Time: 12/13/15  8:10 PM  Result Value Ref Range   Sodium 142 135 - 145  mmol/L   Potassium 3.9 3.5 - 5.1 mmol/L   Chloride 104 101 - 111 mmol/L   BUN 13 6 - 20 mg/dL   Creatinine, Ser 1.00 0.61 - 1.24 mg/dL   Glucose, Bld 98 65 - 99 mg/dL   Calcium, Ion 1.18 1.13 - 1.30 mmol/L   TCO2 25 0 - 100 mmol/L   Hemoglobin 14.6 13.0 - 17.0 g/dL   HCT 43.0 39.0 - 52.0 %   Dg Chest 2 View  12/13/2015  CLINICAL DATA:  Loss of consciousness.  Shortness of breath. EXAM: CHEST  2 VIEW COMPARISON:  December 26, 2014. FINDINGS: The heart size and mediastinal contours are within normal limits. Both lungs are clear. No pneumothorax or pleural effusion is noted. Left-sided pacemaker is unchanged in position. The visualized skeletal structures are unremarkable. IMPRESSION: No active cardiopulmonary disease. Electronically Signed   By: Marijo Conception, M.D.   On: 12/13/2015 19:37   Ct Head Wo Contrast  12/13/2015   CLINICAL DATA:  Code stroke. Loss consciousness today at home after some dizziness at 4:30 p.m. Hit head on floor. Left-sided weakness. EXAM: CT HEAD WITHOUT CONTRAST TECHNIQUE: Contiguous axial images were obtained from the base of the skull through the vertex without intravenous contrast. COMPARISON:  12/26/2014 FINDINGS: Ventricles, cisterns and other CSF spaces are within normal. No evidence of mass, mass effect, shift of midline structures or acute hemorrhage. No evidence of acute infarction. Remaining bones and soft tissues are within normal. IMPRESSION: No acute intracranial findings. These results were called by telephone at the time of interpretation on 12/13/2015 at 8:22 pm to Dr. Gareth Morgan , who verbally acknowledged these results. Electronically Signed   By: Marin Olp M.D.   On: 12/13/2015 20:22   Ct Angio Chest/abd/pel For Dissection W And/or W/wo  12/13/2015  CLINICAL DATA:  Loss of consciousness today at home with some dizziness around 4:30 p.m. Hit head on 4 with left-sided weakness. Shortness of breath. Left arm pain and tingling since 5 p.m. EXAM: CT VENOGRAM CHEST-ABD-PELV FOR DISSECTION W/ AND WO/W CM TECHNIQUE: Axial images were obtained pre contrast administration through the chest with post-contrast axial images CT chest, abdomen and pelvis with coronal and sagittal reformatted images. CONTRAST:  100 mL Isovue 370 IV. COMPARISON:  CT abdomen/ pelvis 12/26/2014 and chest x-ray 12/13/2015 FINDINGS: CT CHEST FINDINGS: The precontrast images unremarkable. Lungs are well inflated without consolidation or effusion. Subtle bilateral posterior dependent atelectasis. Airways are within normal. Left-sided pacemaker is present. Mild prominence of the heart. No evidence pericardial fluid. Evidence of patient's previous coronary artery stents over the region of the left anterior descending and lateral circumflex arteries. Mild prominence of the ascending thoracic aorta measuring 3.7 cm in AP  diameter. No evidence dissection involving the thoracic aorta. Common takeoff of the left common carotid artery and right brachiocephalic artery from the arch. Great vessels are otherwise within normal. Focal calcification along the undersurface of the arch just distal to the takeoff of the left subclavian artery. Mild relative narrowing of the arch just distal to the takeoff of the left subclavian artery. Descending thoracic aorta is normal. No hilar or mediastinal adenopathy. Remaining mediastinal structures are within normal. CT ABDOMEN/PELVIS FINDINGS: Abdominal aorta is normal in caliber very minimal calcified atherosclerotic plaque. There is no evidence of aneurysm dilatation or dissection. The celiac axis, superior mesenteric artery and inferior mesenteric artery and branches are patent. Renal arteries are patent. There is an accessory left renal artery. Iliac arteries are patent. Evidence of  previous cholecystectomy. The liver, spleen, pancreas and adrenal glands are normal. Kidneys normal in size without hydronephrosis or nephrolithiasis. There are a couple sub cm bilateral renal cortical hypodensities too small to characterize but likely cysts. There is diverticulosis of the colon. Appendix is normal. Small bowel is within normal. Stomach is normal. Pelvic images demonstrate the bladder, prostate and rectum to be normal. There are minimal degenerate changes of the spine and hips. IMPRESSION: CT CHEST IMPRESSION: No acute findings.  No evidence of thoracic aortic dissection. 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 Recommend annual imaging followup by CTA or MRA. This recommendation follows 2010 ACCF/AHA/AATS/ACR/ASA/SCA/SCAI/SIR/STS/SVM Guidelines for the Diagnosis and Management of Patients with Thoracic Aortic Disease. Circulation.2010; 121: G665-L935. CT ABDOMEN/PELVIS IMPRESSION: No acute findings in the abdomen/pelvis. No evidence abdominal aortic aneurysm or  dissection. Diverticulosis of the colon. A few tiny sub cm renal cortical hypodensities too small to characterize but likely cysts. Electronically Signed   By: Marin Olp M.D.   On: 12/13/2015 20:56    Assessment: 65 y.o. male with multiple risk factors for stroke as well as previous stroke, presenting with probable recurrent acute stroke involving right PCA territory. Patient has also experienced recurrent episodes of syncope of unclear etiology.  Stroke Risk Factors - hyperlipidemia and hypertension  Plan: 1. HgbA1c, fasting lipid panel 2. CT angiogram of head and neck with contrast 3. PT consult, OT consult 4. Echocardiogram 5. Prophylactic therapy-Antiplatelet med: Aspirin if CT scan of the head shows no intracranial hemorrhage 24 hours post TPA administration 6. Risk factor modification 7. Telemetry monitoring 8. EEG, routine about study  This patient is critically ill and at significant risk of neurological worsening or death, and care requires constant monitoring of vital signs, hemodynamics,respiratory and cardiac monitoring, neurological assessment, discussion with family, other specialists and medical decision making of high complexity. Total critical care time was 65 minutes.  C.R. Nicole Kindred, MD [facility] [phone]  12/13/2015, 9:41 PM

## 2015-12-13 NOTE — ED Provider Notes (Signed)
CSN: [id number]     Arrival date & time 12/13/15  1855 History   First MD Initiated Contact with Patient 12/13/15 1941     Chief Complaint  Patient presents with  . Loss of Consciousness     (Consider location/radiation/quality/duration/timing/severity/associated sxs/prior Treatment) HPI   65 year old male with a history coronary artery disease, pacemaker, diastolic dysfunction, CVA presents to concern for syncopal episode, followed by stabbing chest pain, left-sided weakness, left-sided visual field deficit.  Symptoms began at 5PM suddenly.  Stabbing pain in chest does not radiate, mild at this time.  Syncope at home, and syncope in the ED.  Past Medical History  Diagnosis Date  . Coronary artery disease   . Pacemaker   . Diastolic dysfunction   . Stroke [facility](HCC)    Past Surgical History  Procedure Laterality Date  . Pacemaker insertion    . Cardiac stents times 2    . Cholecystectomy    . Left heart catheterization with coronary angiogram N/A 07/25/2013    Procedure: LEFT HEART CATHETERIZATION WITH CORONARY ANGIOGRAM;  Surgeon: Laurey Moralealton S McLean, MD;  [facility];  Service: Cardiovascular;  Laterality: N/A;   No family history on file. Social History  Substance Use Topics  . Smoking status: Former Games developermoker  . Smokeless tobacco: None  . Alcohol Use: No    Review of Systems  Constitutional: Negative for fever.  HENT: Negative for sore throat.   Eyes: Positive for visual disturbance.  Respiratory: Negative for shortness of breath.   Cardiovascular: Positive for chest pain.  Gastrointestinal: Negative for nausea, vomiting and abdominal pain.  Genitourinary: Negative for difficulty urinating.  Musculoskeletal: Negative for back pain and neck stiffness.  Skin: Negative for rash.  Neurological: Positive for syncope, weakness, light-headedness and numbness. Negative for speech difficulty and headaches.      Allergies  Review of patient's allergies indicates no known  allergies.  Home Medications   Prior to Admission medications   Medication Sig Start Date End Date Taking? Authorizing Provider  acetaminophen (TYLENOL) 500 MG tablet Take 1,000 mg by mouth daily as needed for mild pain.    Yes Historical Provider, MD  aspirin EC 81 MG tablet Take 81 mg by mouth daily.   Yes Historical Provider, MD  atorvastatin (LIPITOR) 80 MG tablet Take 1 tablet (80 mg total) by mouth daily at 6 PM. 07/26/13  Yes Pasty Spillersracy N McLean-Scocozza, MD  Multiple Vitamin (MULTIVITAMIN WITH MINERALS) TABS Take 1 tablet by mouth daily.   Yes Historical Provider, MD  metoprolol tartrate (LOPRESSOR) 12.5 mg TABS tablet Take 0.5 tablets (12.5 mg total) by mouth 2 (two) times daily. Patient not taking: Reported on 12/13/2015 07/26/13   Pasty Spillersracy N McLean-Scocozza, MD   BP 126/86 mmHg  Pulse 63  Temp(Src) 97.7 F (36.5 C) (Oral)  Resp 13  Ht 5\' 11"  (1.803 m)  Wt 185 lb (83.915 kg)  BMI 25.81 kg/m2  SpO2 99% Physical Exam  Constitutional: He is oriented to person, place, and time. He appears well-developed and well-nourished. No distress.  HENT:  Head: Normocephalic and atraumatic.  Eyes: Conjunctivae and EOM are normal.  Left visual field deficit  Neck: Normal range of motion.  Cardiovascular: Normal rate, regular rhythm, normal heart sounds and intact distal pulses.  Exam reveals no gallop and no friction rub.   No murmur heard. Pulmonary/Chest: Effort normal and breath sounds normal. No respiratory distress. He has no wheezes. He has no rales.  Abdominal: Soft. He exhibits no distension. There is no  tenderness. There is no guarding.  Musculoskeletal: He exhibits no edema.  Neurological: He is alert and oriented to person, place, and time. A sensory deficit (left side) is present. No cranial nerve deficit. GCS eye subscore is 4. GCS verbal subscore is 5. GCS motor subscore is 6.  Left sided weakness Left pronator drift, LUE and LLE weakness  Skin: Skin is warm and dry. He is not  diaphoretic.  Nursing note and vitals reviewed.   ED Course  Procedures (including critical care time) Labs Review Labs Reviewed  BASIC METABOLIC PANEL - Abnormal; Notable for the following:    Glucose, Bld 100 (*)    All other components within normal limits  URINALYSIS, ROUTINE W REFLEX MICROSCOPIC (NOT AT [facility]) - Abnormal; Notable for the following:    Hgb urine dipstick TRACE (*)    All other components within normal limits  URINE MICROSCOPIC-ADD ON - Abnormal; Notable for the following:    Squamous Epithelial / LPF 0-5 (*)    Casts HYALINE CASTS (*)    All other components within normal limits  CBC  ETHANOL  DIFFERENTIAL  URINE RAPID DRUG SCREEN, HOSP PERFORMED  CBG MONITORING, ED  I-STAT TROPOININ, ED  I-STAT CHEM 8, ED  I-STAT TROPOININ, ED  I-STAT TROPOININ, ED    Imaging Review Ct Angio Head W/cm &/or Wo Cm  12/14/2015  CLINICAL DATA:  Initial valuation for acute syncope, left-sided weakness. EXAM: CT ANGIOGRAPHY HEAD AND NECK TECHNIQUE: Multidetector CT imaging of the head and neck was performed using the standard protocol during bolus administration of intravenous contrast. Multiplanar CT image reconstructions and MIPs were obtained to evaluate the vascular anatomy. Carotid stenosis measurements (when applicable) are obtained utilizing NASCET criteria, using the distal internal carotid diameter as the denominator. CONTRAST: 50 cc of Isovue 370. COMPARISON:  Prior head CT from earlier the same day. FINDINGS: CTA NECK Aortic arch: Visualized aortic arch is of normal caliber with normal branch pattern. Minimal plaque within the arch itself. No high-grade stenosis at the origin of the great vessels. Visualized subclavian arteries are widely patent. Right carotid system: Right common carotid artery widely patent from its origin to the bifurcation. Minimal noncalcified plaque about the right bifurcation without significant stenosis. Right ICA widely patent from the bifurcation to  the skullbase. No flow limiting stenosis, dissection, or vascular occlusion within the right carotid artery system. Right external carotid artery and its branches within normal limits. Left carotid system: Left common carotid artery widely patent from its origin to the bifurcation. No significant plaque about the left bifurcation. Left ICA widely patent from the bifurcation to the skullbase. No dissection, stenosis, or vascular occlusion within the left carotid artery system. Left external carotid artery and its branches within normal limits. Vertebral arteries:Both vertebral arteries arise from the subclavian arteries. Left vertebral artery is dominant. Vertebral arteries are widely patent without stenosis, dissection, or occlusion. Skeleton: No acute osseous abnormality. No worrisome lytic or blastic osseous lesions. Mild degenerative spondylolysis at C5-6 and C6-7. Other neck: Mild atelectatic changes seen dependently within the visualized lungs. Visualized lungs are otherwise clear. Visualized superior mediastinum within normal limits. Thyroid gland normal. No adenopathy within the neck. No acute soft tissue abnormality. CTA HEAD Anterior circulation: The petrous segments widely patent bilaterally. Scattered calcified atheromatous plaque within the cavernous/ supraclinoid ICAs with mild diffuse narrowing (less than 25%). A1 segments widely patent. Anterior communicating artery normal. Anterior cerebral arteries well opacified to their distal aspects. M1 segments patent without occlusion. Mild smooth narrowing  of the distal left M1 segment (series 705, image 16). Right M1 segment widely patent. MCA bifurcations normal. No proximal M2 branch stenosis or occlusion. Distal MCA branches well opacified and symmetric. Posterior circulation: Dominant left vertebral artery widely patent to the vertebrobasilar junction. Minimal plaque within the left V4 segment without stenosis. Hypoplastic right vertebral artery largely  terminates and high Ki, although a small hypoplastic branch ascends towards the vertebrobasilar junction. Posterior inferior cerebral arteries themselves are patent. Basilar artery widely patent. Superior cerebellar arteries well opacified bilaterally. Both of the posterior cerebral arteries arise from the basilar artery and are well opacified to their distal aspects. Moderate short-segment stenosis of the right P2 segment just prior to its bifurcation (series 706, image 15). Mild atheromatous irregularity within the right P2 segment proximally. Venous sinuses: Patent without evidence for venous sinus thrombosis. Anatomic variants: No anatomic variant. No aneurysm or vascular malformation. Delayed phase: No pathologic enhancement on delayed sequence. IMPRESSION: CTA NECK IMPRESSION: 1. No high-grade or critical stenosis identified within the major arterial vasculature of the neck. 2. Minimal noncalcified atheromatous plaque about the right carotid bifurcation without stenosis. 3. Dominant left vertebral artery. CTA HEAD IMPRESSION: 1. No emergent large or proximal arterial branch occlusion within the intracranial circulation. No high-grade or correctable stenosis. 2. Atheromatous plaque within the cavernous ICAs with secondary mild diffuse narrowing (less than 25%). 3. Mild smooth narrowing of the distal left M1 segment. 4. Short-segment moderate stenosis within the distal left P2 segment, with mild atheromatous irregularity proximally. Electronically Signed   By: Rise Mu M.D.   On: 12/14/2015 00:40   Dg Chest 2 View  12/13/2015  CLINICAL DATA:  Loss of consciousness.  Shortness of breath. EXAM: CHEST  2 VIEW COMPARISON:  December 26, 2014. FINDINGS: The heart size and mediastinal contours are within normal limits. Both lungs are clear. No pneumothorax or pleural effusion is noted. Left-sided pacemaker is unchanged in position. The visualized skeletal structures are unremarkable. IMPRESSION: No active  cardiopulmonary disease. Electronically Signed   By: Lupita Raider, M.D.   On: 12/13/2015 19:37   Ct Head Wo Contrast  12/13/2015  CLINICAL DATA:  Code stroke. Loss consciousness today at home after some dizziness at 4:30 p.m. Hit head on floor. Left-sided weakness. EXAM: CT HEAD WITHOUT CONTRAST TECHNIQUE: Contiguous axial images were obtained from the base of the skull through the vertex without intravenous contrast. COMPARISON:  12/26/2014 FINDINGS: Ventricles, cisterns and other CSF spaces are within normal. No evidence of mass, mass effect, shift of midline structures or acute hemorrhage. No evidence of acute infarction. Remaining bones and soft tissues are within normal. IMPRESSION: No acute intracranial findings. These results were called by telephone at the time of interpretation on 12/13/2015 at 8:22 pm to Dr. Alvira Monday , who verbally acknowledged these results. Electronically Signed   By: Elberta Fortis M.D.   On: 12/13/2015 20:22   Ct Angio Neck W/cm &/or Wo/cm  12/14/2015  CLINICAL DATA:  Initial valuation for acute syncope, left-sided weakness. EXAM: CT ANGIOGRAPHY HEAD AND NECK TECHNIQUE: Multidetector CT imaging of the head and neck was performed using the standard protocol during bolus administration of intravenous contrast. Multiplanar CT image reconstructions and MIPs were obtained to evaluate the vascular anatomy. Carotid stenosis measurements (when applicable) are obtained utilizing NASCET criteria, using the distal internal carotid diameter as the denominator. CONTRAST: 50 cc of Isovue 370. COMPARISON:  Prior head CT from earlier the same day. FINDINGS: CTA NECK Aortic arch: Visualized aortic arch is  of normal caliber with normal branch pattern. Minimal plaque within the arch itself. No high-grade stenosis at the origin of the great vessels. Visualized subclavian arteries are widely patent. Right carotid system: Right common carotid artery widely patent from its origin to the  bifurcation. Minimal noncalcified plaque about the right bifurcation without significant stenosis. Right ICA widely patent from the bifurcation to the skullbase. No flow limiting stenosis, dissection, or vascular occlusion within the right carotid artery system. Right external carotid artery and its branches within normal limits. Left carotid system: Left common carotid artery widely patent from its origin to the bifurcation. No significant plaque about the left bifurcation. Left ICA widely patent from the bifurcation to the skullbase. No dissection, stenosis, or vascular occlusion within the left carotid artery system. Left external carotid artery and its branches within normal limits. Vertebral arteries:Both vertebral arteries arise from the subclavian arteries. Left vertebral artery is dominant. Vertebral arteries are widely patent without stenosis, dissection, or occlusion. Skeleton: No acute osseous abnormality. No worrisome lytic or blastic osseous lesions. Mild degenerative spondylolysis at C5-6 and C6-7. Other neck: Mild atelectatic changes seen dependently within the visualized lungs. Visualized lungs are otherwise clear. Visualized superior mediastinum within normal limits. Thyroid gland normal. No adenopathy within the neck. No acute soft tissue abnormality. CTA HEAD Anterior circulation: The petrous segments widely patent bilaterally. Scattered calcified atheromatous plaque within the cavernous/ supraclinoid ICAs with mild diffuse narrowing (less than 25%). A1 segments widely patent. Anterior communicating artery normal. Anterior cerebral arteries well opacified to their distal aspects. M1 segments patent without occlusion. Mild smooth narrowing of the distal left M1 segment (series 705, image 16). Right M1 segment widely patent. MCA bifurcations normal. No proximal M2 branch stenosis or occlusion. Distal MCA branches well opacified and symmetric. Posterior circulation: Dominant left vertebral artery  widely patent to the vertebrobasilar junction. Minimal plaque within the left V4 segment without stenosis. Hypoplastic right vertebral artery largely terminates and high Ki, although a small hypoplastic branch ascends towards the vertebrobasilar junction. Posterior inferior cerebral arteries themselves are patent. Basilar artery widely patent. Superior cerebellar arteries well opacified bilaterally. Both of the posterior cerebral arteries arise from the basilar artery and are well opacified to their distal aspects. Moderate short-segment stenosis of the right P2 segment just prior to its bifurcation (series 706, image 15). Mild atheromatous irregularity within the right P2 segment proximally. Venous sinuses: Patent without evidence for venous sinus thrombosis. Anatomic variants: No anatomic variant. No aneurysm or vascular malformation. Delayed phase: No pathologic enhancement on delayed sequence. IMPRESSION: CTA NECK IMPRESSION: 1. No high-grade or critical stenosis identified within the major arterial vasculature of the neck. 2. Minimal noncalcified atheromatous plaque about the right carotid bifurcation without stenosis. 3. Dominant left vertebral artery. CTA HEAD IMPRESSION: 1. No emergent large or proximal arterial branch occlusion within the intracranial circulation. No high-grade or correctable stenosis. 2. Atheromatous plaque within the cavernous ICAs with secondary mild diffuse narrowing (less than 25%). 3. Mild smooth narrowing of the distal left M1 segment. 4. Short-segment moderate stenosis within the distal left P2 segment, with mild atheromatous irregularity proximally. Electronically Signed   By: Rise Mu M.D.   On: 12/14/2015 00:40   Ct Angio Chest/abd/pel For Dissection W And/or W/wo  12/13/2015  CLINICAL DATA:  Loss of consciousness today at home with some dizziness around 4:30 p.m. Hit head on 4 with left-sided weakness. Shortness of breath. Left arm pain and tingling since 5 p.m.  EXAM: CT VENOGRAM CHEST-ABD-PELV FOR DISSECTION W/ AND  WO/W CM TECHNIQUE: Axial images were obtained pre contrast administration through the chest with post-contrast axial images CT chest, abdomen and pelvis with coronal and sagittal reformatted images. CONTRAST:  100 mL Isovue 370 IV. COMPARISON:  CT abdomen/ pelvis 12/26/2014 and chest x-ray 12/13/2015 FINDINGS: CT CHEST FINDINGS: The precontrast images unremarkable. Lungs are well inflated without consolidation or effusion. Subtle bilateral posterior dependent atelectasis. Airways are within normal. Left-sided pacemaker is present. Mild prominence of the heart. No evidence pericardial fluid. Evidence of patient's previous coronary artery stents over the region of the left anterior descending and lateral circumflex arteries. Mild prominence of the ascending thoracic aorta measuring 3.7 cm in AP diameter. No evidence dissection involving the thoracic aorta. Common takeoff of the left common carotid artery and right brachiocephalic artery from the arch. Great vessels are otherwise within normal. Focal calcification along the undersurface of the arch just distal to the takeoff of the left subclavian artery. Mild relative narrowing of the arch just distal to the takeoff of the left subclavian artery. Descending thoracic aorta is normal. No hilar or mediastinal adenopathy. Remaining mediastinal structures are within normal. CT ABDOMEN/PELVIS FINDINGS: Abdominal aorta is normal in caliber very minimal calcified atherosclerotic plaque. There is no evidence of aneurysm dilatation or dissection. The celiac axis, superior mesenteric artery and inferior mesenteric artery and branches are patent. Renal arteries are patent. There is an accessory left renal artery. Iliac arteries are patent. Evidence of previous cholecystectomy. The liver, spleen, pancreas and adrenal glands are normal. Kidneys normal in size without hydronephrosis or nephrolithiasis. There are a couple sub cm  bilateral renal cortical hypodensities too small to characterize but likely cysts. There is diverticulosis of the colon. Appendix is normal. Small bowel is within normal. Stomach is normal. Pelvic images demonstrate the bladder, prostate and rectum to be normal. There are minimal degenerate changes of the spine and hips. IMPRESSION: CT CHEST IMPRESSION: No acute findings.  No evidence of thoracic aortic dissection. 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 Recommend annual imaging followup by CTA or MRA. This recommendation follows 2010 ACCF/AHA/AATS/ACR/ASA/SCA/SCAI/SIR/STS/SVM Guidelines for the Diagnosis and Management of Patients with Thoracic Aortic Disease. Circulation.2010; 121: Z610-R604. CT ABDOMEN/PELVIS IMPRESSION: No acute findings in the abdomen/pelvis. No evidence abdominal aortic aneurysm or dissection. Diverticulosis of the colon. A few tiny sub cm renal cortical hypodensities too small to characterize but likely cysts. Electronically Signed   By: Elberta Fortis M.D.   On: 12/13/2015 20:56   I have personally reviewed and evaluated these images and lab results as part of my medical decision-making.   EKG Interpretation   Date/Time:  Monday Dec 13 2015 19:16:37 EDT Ventricular Rate:  70 PR Interval:  92 QRS Duration: 105 QT Interval:  398 QTC Calculation: 429 R Axis:   -5 Text Interpretation:  Atrial-paced complexes No further analysis attempted  due to paced rhythm No significant change since last tracing Confirmed by  [facility] MD, Prince Olivier ([id number]) on 12/13/2015 7:55:58 PM      CRITICAL CARE: Code Stroke Performed by: Rhae Lerner   Total critical care time: 30 minutes  Critical care time was exclusive of separately billable procedures and treating other patients.  Critical care was necessary to treat or prevent imminent or life-threatening deterioration.  Critical care was time spent personally by me on the following  activities: development of treatment plan with patient and/or surrogate as well as nursing, discussions with consultants, evaluation of patient's response to treatment, examination of patient,  obtaining history from patient or surrogate, ordering and performing treatments and interventions, ordering and review of laboratory studies, ordering and review of radiographic studies, pulse oximetry and re-evaluation of patient's condition.  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 Given left-sided weakness, visual field deficit noted on exam, code stroke was called. Given concern for pulse differential on lower extremities, and description of chest pain with syncope, emergent CT angiogram dissection study was completed. Patient remained hemodynamic dynamically stable and was taken emergently to [facility].  CT angio resulted with no dissection. EKG without STEMI.  Pt emergently taken to [facility] as Code Stroke.      Alvira Monday, MD 12/14/15 [phone]

## 2015-12-13 NOTE — ED Notes (Signed)
[facility] called for transportation to [facility] 

## 2015-12-13 NOTE — ED Notes (Signed)
Patient transported to X-ray 

## 2015-12-13 NOTE — ED Notes (Signed)
Pt had a syncopal episode while in radiology  Pt brought back to triage and moved to Res [facility]

## 2015-12-13 NOTE — ED Notes (Signed)
Patient reports improving chest pain, but reports that he does not want NTG d/t side effect of headache.

## 2015-12-13 NOTE — ED Provider Notes (Signed)
[facility]. 65 year old male with history of recurrent syncope with a pacemaker, recent CVA presents the ED for syncopal episode that occurred today. Patient reports that he was at home at rest when he felt sudden onset dizziness and diaphoresis. Patient had a syncopal episode and was offered to 3 minutes. When he woke up he had 5-10 times for chest pain and felt very weak on his left side. Patient also reports left-sided visual deficits. [facility] he also had another syncopal event while in x-ray. CT head obtained which was normal. Patient was sent to ED pending that he might need TPA for an acute stroke. Patient also received a CT chest dissection study given that he had chest pain which was normal. Upon arrival to the ED Dr. Roseanne RenoStewart, neurologist was at bedside who recommends TPA given that patient has notable left-sided deficits on exam. Strength 3-5 in left upper and left lower extremity. Dysmetria is present. Patient has left-sided visual field deficits as well. Patient still complaining of active chest pain, 5 out of 10. Patient only had 1 IV in the right AC on arrival. Left EJ was placed and TPA was administered. Patient is now hemodynamically stable and will be admitted to the neuro ICU.  CRITICAL CARE Performed by: Dub MikesSamantha Tripp Dowless   Total critical care time: 45 minutes  Critical care time was exclusive of separately billable procedures and treating other patients.  Critical care was necessary to treat or prevent imminent or life-threatening deterioration.  Critical care was time spent personally by me on the following activities: development of treatment plan with patient and/or surrogate as well as nursing, discussions with consultants, evaluation of patient's response to treatment, examination of patient, obtaining history from patient or surrogate, ordering and performing treatments and interventions, ordering and  review of laboratory studies, ordering and review of radiographic studies, pulse oximetry and re-evaluation of patient's condition.   Lester KinsmanSamantha Tripp [place], PA-C 12/13/15 2207  Jacalyn LefevreJulie Haviland, MD 12/14/15 [phone]

## 2015-12-13 NOTE — ED Notes (Signed)
Pt reports losing consciousness at home today after some dizziness around 1630.  Reports "being out" for 2-3 minutes.  Denies hitting head on the floor, states family caught him.  Complains of left sided weakness.  Denies N&V.  Positive for SOB.

## 2015-12-14 ENCOUNTER — Inpatient Hospital Stay (HOSPITAL_COMMUNITY): Payer: Medicare Other

## 2015-12-14 DIAGNOSIS — I110 Hypertensive heart disease with heart failure: Secondary | ICD-10-CM | POA: Diagnosis present

## 2015-12-14 DIAGNOSIS — Z950 Presence of cardiac pacemaker: Secondary | ICD-10-CM | POA: Diagnosis not present

## 2015-12-14 DIAGNOSIS — I2510 Atherosclerotic heart disease of native coronary artery without angina pectoris: Secondary | ICD-10-CM | POA: Diagnosis present

## 2015-12-14 DIAGNOSIS — I639 Cerebral infarction, unspecified: Secondary | ICD-10-CM

## 2015-12-14 DIAGNOSIS — F449 Dissociative and conversion disorder, unspecified: Secondary | ICD-10-CM

## 2015-12-14 DIAGNOSIS — G8194 Hemiplegia, unspecified affecting left nondominant side: Secondary | ICD-10-CM | POA: Diagnosis present

## 2015-12-14 DIAGNOSIS — Z8673 Personal history of transient ischemic attack (TIA), and cerebral infarction without residual deficits: Secondary | ICD-10-CM | POA: Diagnosis not present

## 2015-12-14 DIAGNOSIS — I6789 Other cerebrovascular disease: Secondary | ICD-10-CM

## 2015-12-14 DIAGNOSIS — R29705 NIHSS score 5: Secondary | ICD-10-CM | POA: Diagnosis present

## 2015-12-14 DIAGNOSIS — I5032 Chronic diastolic (congestive) heart failure: Secondary | ICD-10-CM | POA: Diagnosis present

## 2015-12-14 DIAGNOSIS — R2990 Unspecified symptoms and signs involving the nervous system: Secondary | ICD-10-CM | POA: Diagnosis present

## 2015-12-14 DIAGNOSIS — H5347 Heteronymous bilateral field defects: Secondary | ICD-10-CM | POA: Diagnosis present

## 2015-12-14 DIAGNOSIS — Z955 Presence of coronary angioplasty implant and graft: Secondary | ICD-10-CM | POA: Diagnosis not present

## 2015-12-14 DIAGNOSIS — F4323 Adjustment disorder with mixed anxiety and depressed mood: Secondary | ICD-10-CM | POA: Diagnosis present

## 2015-12-14 DIAGNOSIS — E785 Hyperlipidemia, unspecified: Secondary | ICD-10-CM

## 2015-12-14 DIAGNOSIS — Z79899 Other long term (current) drug therapy: Secondary | ICD-10-CM | POA: Diagnosis not present

## 2015-12-14 DIAGNOSIS — Z87891 Personal history of nicotine dependence: Secondary | ICD-10-CM | POA: Diagnosis not present

## 2015-12-14 DIAGNOSIS — R55 Syncope and collapse: Secondary | ICD-10-CM | POA: Diagnosis present

## 2015-12-14 LAB — LIPID PANEL
CHOL/HDL RATIO: 3.3 ratio
Cholesterol: 140 mg/dL (ref 0–200)
HDL: 43 mg/dL (ref >40)
LDL Cholesterol: 87 mg/dL (ref 0–99)
TRIGLYCERIDES: 52 mg/dL (ref <150)
VLDL: 10 mg/dL (ref 0–40)

## 2015-12-14 LAB — MRSA PCR SCREENING: MRSA BY PCR: NEGATIVE

## 2015-12-14 LAB — ECHOCARDIOGRAM COMPLETE
Height: 71 in
WEIGHTICAEL: 2881.85 [oz_av]

## 2015-12-14 MED ORDER — ATORVASTATIN CALCIUM 80 MG PO TABS
80.0000 mg | ORAL_TABLET | Freq: Every day | ORAL | Status: DC
  Administered 2015-12-14 – 2015-12-16 (×3): 80 mg via ORAL
  Filled 2015-12-14 (×3): qty 1

## 2015-12-14 MED ORDER — SODIUM CHLORIDE 0.9 % IV SOLN
INTRAVENOUS | Status: DC
  Administered 2015-12-14: 05:00:00 via INTRAVENOUS

## 2015-12-14 MED ORDER — ADULT MULTIVITAMIN W/MINERALS CH
1.0000 | ORAL_TABLET | Freq: Every day | ORAL | Status: DC
  Administered 2015-12-14 – 2015-12-17 (×4): 1 via ORAL
  Filled 2015-12-14 (×4): qty 1

## 2015-12-14 MED ORDER — PANTOPRAZOLE SODIUM 40 MG IV SOLR: 40.0000 mg | Freq: Every day | INTRAVENOUS | Status: DC

## 2015-12-14 MED ORDER — METOPROLOL TARTRATE 12.5 MG HALF TABLET: 12.5000 mg | ORAL_TABLET | Freq: Two times a day (BID) | ORAL | Status: DC

## 2015-12-14 MED ORDER — ONDANSETRON HCL 4 MG/2ML IJ SOLN
4.0000 mg | INTRAMUSCULAR | Status: DC | PRN
  Administered 2015-12-14 – 2015-12-17 (×5): 4 mg via INTRAVENOUS
  Filled 2015-12-14 (×5): qty 2

## 2015-12-14 MED ORDER — LABETALOL HCL 5 MG/ML IV SOLN: 10.0000 mg | INTRAVENOUS | Status: DC | PRN

## 2015-12-14 MED ORDER — ACETAMINOPHEN 325 MG PO TABS: 650.0000 mg | ORAL_TABLET | ORAL | Status: DC | PRN

## 2015-12-14 MED ORDER — ASPIRIN 81 MG PO CHEW
81.0000 mg | CHEWABLE_TABLET | Freq: Every day | ORAL | Status: DC
  Administered 2015-12-14 – 2015-12-17 (×4): 81 mg via ORAL
  Filled 2015-12-14 (×4): qty 1

## 2015-12-14 MED ORDER — ACETAMINOPHEN 650 MG RE SUPP: 650.0000 mg | RECTAL | Status: DC | PRN

## 2015-12-14 MED ORDER — STROKE: EARLY STAGES OF RECOVERY BOOK
Freq: Once | Status: AC
  Administered 2015-12-15: 05:00:00
  Filled 2015-12-14: qty 1

## 2015-12-14 NOTE — Progress Notes (Signed)
*  PRELIMINARY RESULTS* Echocardiogram 2D Echocardiogram has been performed.  Jacob Pineda, Jacob Pineda 12/14/2015, 12:42 PM

## 2015-12-14 NOTE — Progress Notes (Signed)
EEG Completed; Results Pending  

## 2015-12-14 NOTE — ED Notes (Signed)
Attempted report 

## 2015-12-14 NOTE — Progress Notes (Addendum)
STROKE TEAM PROGRESS NOTE   HISTORY OF PRESENT ILLNESS (per record) Jacob Pineda is an 65 y.o. male history of hypertension, hyperlipidemia, coronary artery disease, bradycardia with pacemaker placement, and previous stroke, presenting with recurrent syncopal spells as well as loss of vision on the left side and numbness involving left face arm and leg. Code stroke was activated at [facility]. [facility] management. He reportedly experienced 2 episodes of syncope [facility]. CT scan of his head showed no acute intracranial abnormality. He complained of chest pain and also underwent CT angiogram of his chest which was unremarkable. NIH stroke score was 5. Patient was deemed a candidate for intravenous thrombolytic therapy with TPA, which was administered. Patient was subsequently admitted to neuro intensive care unit for further management. He was LKW at 5:00 PM on 12/13/2015.    SUBJECTIVE (INTERVAL HISTORY) No family is at the bedside.  Overall he feels his condition is gradually improving. However, he still has left hemianopia and left hemiparesis and left hemi sensory decrease. He denies any recent stress or anxiety.   He had almost exactly same story in 11/17/2015 and [place]. As per the report "Patient is a 65 y.o. Caucasian male who presented on 4/16 with left sided weakness and vision loss that had started earlier that day. Patient noted what sounds like a syncopal event around 11AM, after which he started to experience the neurologic symptoms. Patient with CT head that did not show any acute abnormalities but NIHSS 5-6 so patient given TPA. Lipid profile showed LDL 101 with normal cholesterol and trig. A1c 5.4. Patient with obvious left sided weakness but questionable whether he is embellishing his symptoms as they appear to improve with distraction. Symptoms continue this morning but do appear to be mildly  improved from yesterday. Plan of care reviewed with Dr. Napoleon FormAbsher." "Clinical stroke syndrome although CT negative. Unable to obtain MRI d/t pacer. CTA head/neck personally reviewed and are negative." "Pt states that prior to his L sided weakness and visual field changes he felt dizzy and lost consciousness. He lives alone but knows this event lasted a few min because he started warming water in a kettle and when he regained consciousness the water hadn't begun to boil yet. Denies incontinence, tongue biting, and palpitations." EEG result pending at the time of discharge.  He [facility] for exactly same story. As per note: "65 y.o. male with a past medical history of coronary artery disease and sick-sinus syndrome s/p pacemaker who was [facility] for left sided weakness and left sided visual field deficit. Patient represents with recurrence of left sided weakness and left visual field cut. He was previously treated on 11/17/15 with tPA for similar symptoms with complete resolution at the time of discharge from rehab. This episode was precipitated by a syncopal episode and chest pain. Cardiology evaluated his pacemaker without any evidence of arrhythmia or pacemaker dysfunction. His presentation is inconsistent with an acute stroke. In addition, on exam, he has functional neurological symptoms as demonstrated by positive Hoover's sign, reverse Hoover's sign, and was able to grab his thumb without visual cues despite having absent joint position sense. He has also had multiple admissions over the past 1-2 years with similar neurologic symptoms at multiple institutions and has received tPA at least twice. There was no clear secondary gain. His exam greatly improved during the hospital course with  return of his strength to 5/5 in all extremities and resolution of his visual field deficits. Interestingly, physical therapy also noted inconsistent weakness while working with  him. Multiple discussions were held with the patient to attempt to provide the patient with an opportunity to discuss social or psychological factors that may be causing these symptoms. Ultimately, the patient denied and psychosocial difficulties. He was discharged without any changes to his medication and without any indication for physical/occupational therapies."   OBJECTIVE Temp:  [97.7 F (36.5 C)-98.5 F (36.9 C)] 98.1 F (36.7 C) (05/23 0432) Pulse Rate:  [59-63] 63 (05/23 0600) Cardiac Rhythm:  [-] Normal sinus rhythm (05/23 0400) Resp:  [10-24] 20 (05/23 0600) BP: (108-152)/(60-92) 127/83 mmHg (05/23 0600) SpO2:  [96 %-99 %] 98 % (05/23 0600) Weight:  [81.7 kg (180 lb 1.9 oz)-83.915 kg (185 lb)] 81.7 kg (180 lb 1.9 oz) (05/23 0415)  CBC:  Recent Labs Lab 12/13/15 2001 12/13/15 2010  WBC 7.4  --   NEUTROABS 4.5  --   HGB 13.3 14.6  HCT 40.8 43.0  MCV 91.9  --   PLT 202  --     Basic Metabolic Panel:  Recent Labs Lab 12/13/15 2001 12/13/15 2010  NA 136 142  K 4.7 3.9  CL 107 104  CO2 22  --   GLUCOSE 100* 98  BUN 12 13  CREATININE 1.11 1.00  CALCIUM 9.2  --     Lipid Panel:    Component Value Date/Time   CHOL 140 12/14/2015 0535   TRIG 52 12/14/2015 0535   HDL 43 12/14/2015 0535   CHOLHDL 3.3 12/14/2015 0535   VLDL 10 12/14/2015 0535   LDLCALC 87 12/14/2015 0535   HgbA1c:  Lab Results  Component Value Date   HGBA1C 5.7* 07/25/2013   Urine Drug Screen:    Component Value Date/Time   LABOPIA NONE DETECTED 12/13/2015 2247   COCAINSCRNUR NONE DETECTED 12/13/2015 2247   LABBENZ NONE DETECTED 12/13/2015 2247   AMPHETMU NONE DETECTED 12/13/2015 2247   THCU NONE DETECTED 12/13/2015 2247   LABBARB NONE DETECTED 12/13/2015 2247      IMAGING I have personally reviewed the radiological images below and agree with the radiology interpretations.  Dg Chest 2 View 12/13/2015   No active cardiopulmonary disease.   Ct Head Wo Contrast  12/14/2015   12/13/2015   No acute intracranial findings.   CTA NECK  12/14/2015   1. No high-grade or critical stenosis identified within the major arterial vasculature of the neck. 2. Minimal noncalcified atheromatous plaque about the right carotid bifurcation without stenosis. 3. Dominant left vertebral artery.   CTA HEAD  12/14/2015   1. No emergent large or proximal arterial branch occlusion within the intracranial circulation. No high-grade or correctable stenosis. 2. Atheromatous plaque within the cavernous ICAs with secondary mild diffuse narrowing (less than 25%). 3. Mild smooth narrowing of the distal left M1 segment. 4. Short-segment moderate stenosis within the distal left P2 segment, with mild atheromatous irregularity proximally.   Ct Angio Chest/abd/pel For Dissection W And/or W/wo 12/13/2015   No acute findings.  No evidence of thoracic aortic dissection. 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   EEG - This is a normal EEG for the patients stated age. There were no focal, hemispheric or lateralizing features. No epileptiform activity was recorded. A normal EEG does not exclude the diagnosis of a seizure disorder and if seizure remains high on the list of differential  diagnosis, an ambulatory EEG may be of value. Clinical correlation is required.  Repeat CT head pending   PHYSICAL EXAM  Temp:  [97.7 F (36.5 C)-98.5 F (36.9 C)] 98.5 F (36.9 C) (05/23 1103) Pulse Rate:  [59-63] 60 (05/23 1200) Resp:  [10-24] 14 (05/23 1200) BP: (108-152)/(60-92) 109/64 mmHg (05/23 1200) SpO2:  [96 %-99 %] 98 % (05/23 1200) Weight:  [180 lb 1.9 oz (81.7 kg)-185 lb (83.915 kg)] 180 lb 1.9 oz (81.7 kg) (05/23 0415)  General - Well nourished, well developed, in no apparent distress.  Ophthalmologic - Sharp disc margins OU.   Cardiovascular - Regular rate and rhythm with no murmur.  Mental Status -  Level of arousal and orientation to time, place, and person  were intact. Language including expression, naming, repetition, comprehension was assessed and found intact. Fund of Knowledge was assessed and was intact.  Cranial Nerves II - XII - II - left hemianopia. However, able to track object at left visual side without difficulty.  III, IV, VI - Extraocular movements intact. V - Facial sensation intact bilaterally. VII - Facial movement intact bilaterally. VIII - Hearing & vestibular intact bilaterally. X - Palate elevates symmetrically. XI - Chin turning & shoulder shrug intact bilaterally. XII - Tongue protrusion intact.  Motor Strength - The patient's strength was 4/5 LUE with drift, however no pronation, and also drift would stop with distraction, 3/5 LLE with positive hoover's sign. Bulk was normal and fasciculations were absent.   Motor Tone - Muscle tone was assessed at the neck and appendages and was normal.  Reflexes - The patient's reflexes were 1+ in all extremities and he had no pathological reflexes.  Sensory - Light touch, temperature/pinprick were assessed and were decreased on the left, with 30-40% comparing with right.    Coordination - The patient had normal movements in the hands with no ataxia or dysmetria.  Tremor was absent.  Gait and Station - not tested due to safety concerns post tPA.   ASSESSMENT/PLAN Jacob Pineda is a 65 y.o. male with history of hypertension, hyperlipidemia, coronary artery disease, bradycardia with pacemaker placement, and previous stroke presenting with recurrent syncopal spells, loss of vision on the left side and left face arm and leg numnbess. He  received IV t-PA 12/13/3015 at 2125.   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 Exam consistent with functional component.   Pt had admissions 4/26 in [place], [place] and 5/12 at [facility] for exact same presentation,  received tPA on 4/26, but found both times having functional presentation. Noted that pt had similar admission past 1-2 years in multiple institution for exact same symptoms, all resolved fully.   MRI  / MRA  pacemaker  CTA head narrowing L M1, mod stenosis distal L P2  CTA neck No significant stenosis   CTA chest/pevlis/ abd no acute findings. Diverticulosis of colon. Likely renal cysts, mild ectasia of ascending thoracic aorta  Repeat CT head pending  2D Echo  Pending  EEG normal  LDL 87  HgbA1c pending  SCDs for VTE prophylaxis  Diet NPO time specified  aspirin 81 mg daily prior to admission, now on No antithrombotic as within 24h of IV tPA administration  Ongoing aggressive stroke risk factor management  Therapy recommendations:  pending   Disposition:  pending   Hyperlipidemia  Home meds:  lipitor 80  LDL 87, goal < 70  Resume lipitor once able to swallow  Continue statin at discharge  Other Stroke Risk Factors  Advanced age  Former Cigarette smoker  Coronary artery disease w/ stent 2012  Other Active Problems  Diastolic dysfunction  Symptomatic bradycardia s/p Pacemaker 2012  Pt denies recent stress  Hospital day # 0  Marvel Plan, MD PhD Stroke Neurology 12/14/2015 1:23 PM      To contact Stroke Continuity provider, please refer to [email or web address]. After hours, contact General Neurology

## 2015-12-14 NOTE — Procedures (Signed)
HPI:  65 y/o with recurrent syncopal episodes and stroke, s/p TPA  TECHNICAL SUMMARY:  A multichannel referential and bipolar montage EEG using the standard international 10-20 system was performed on the patient described as awake and briefly drowsy.  The dominant background activity consists of 8.5 hertz activity seen most prominantly over the posterior head region.  The backgound activity is minimally reactive to eye opening and closing procedures.  Low voltage fast (beta) activity is distributed symmetrically and maximally over the anterior head regions.  ACTIVATION:  Stepwise photic stimulation and hyperventilation were not performed.  EPILEPTIFORM ACTIVITY:  There were no spikes, sharp waves or paroxysmal activity.  SLEEP:  Brief physiologic drowsiness but no stage 2 sleep  CARDIAC:  The EKG lead revealed a regular sinus rhythm.  IMPRESSION:  This is a normal EEG for the patients stated age.  There were no focal, hemispheric or lateralizing features.  No epileptiform activity was recorded.  A normal EEG does not exclude the diagnosis of a seizure disorder and if seizure remains high on the list of differential diagnosis, an ambulatory EEG may be of value.  Clinical correlation is required.

## 2015-12-15 LAB — HEMOGLOBIN A1C
HEMOGLOBIN A1C: 5.8 % — AB (ref 4.8–5.6)
MEAN PLASMA GLUCOSE: 120 mg/dL

## 2015-12-15 MED ORDER — LABETALOL HCL 5 MG/ML IV SOLN: 10.0000 mg | INTRAVENOUS | Status: DC | PRN

## 2015-12-15 NOTE — Progress Notes (Signed)
Inpatient Rehabilitation  OT is recommending IP Rehab. Per neurology note of 12/14/15, pt. Likely with conversion disorder.   This diagnosis is not considered a rehab diagnosis, therefore we are not recommending IP Rehab consult at this time.  Please call if questions.  Weldon PickingSusan Cristela Stalder PT Inpatient Rehab Admissions Coordinator Cell [phone] Office [phone]

## 2015-12-15 NOTE — Care Management Note (Signed)
Case Management Note  Patient Details  Name: Jacob Pineda MRN: [id number] Date of Birth: 26-May-1951  Subjective/Objective:                    Action/Plan: Patient was admitted with Recurrent syncope with left visual field defect and left-sided numbness. Received TPA at admission. Lives at home alone. Will follow for discharge needs pending PT/OT evals and physician orders.  Expected Discharge Date:  12/17/15               Expected Discharge Plan:     In-House Referral:     Discharge planning Services     Post Acute Care Choice:    Choice offered to:     DME Arranged:    DME Agency:     HH Arranged:    HH Agency:     Status of Service:  In process, will continue to follow  Medicare Important Message Given:    Date Medicare IM Given:    Medicare IM give by:    Date Additional Medicare IM Given:    Additional Medicare Important Message give by:     If discussed at Long Length of Stay Meetings, dates discussed:    Additional CommentsAnda Pineda:  Jacob Coriz C, RN 12/15/2015, 9:20 AM [phone]

## 2015-12-15 NOTE — Progress Notes (Signed)
STROKE TEAM PROGRESS NOTE   SUBJECTIVE (INTERVAL HISTORY) Pt stated that he still has left sided weakness and left side visual field deficit, but able to walk with walker to bathroom but he said his left leg buckles. CT repeat negative. Pt transferred to floor last night.   OBJECTIVE Temp:  [97.7 F (36.5 C)-98.9 F (37.2 C)] 98.5 F (36.9 C) (05/24 1000) Pulse Rate:  [60-65] 65 (05/24 1000) Cardiac Rhythm:  [-] Heart block (05/24 0910) Resp:  [15-18] 15 (05/24 1000) BP: (101-130)/(55-75) 122/64 mmHg (05/24 1000) SpO2:  [96 %-98 %] 97 % (05/24 1000) Weight:  [84.913 kg (187 lb 3.2 oz)] 84.913 kg (187 lb 3.2 oz) (05/23 2250)  CBC:   Recent Labs Lab 12/13/15 2001 12/13/15 2010  WBC 7.4  --   NEUTROABS 4.5  --   HGB 13.3 14.6  HCT 40.8 43.0  MCV 91.9  --   PLT 202  --     Basic Metabolic Panel:   Recent Labs Lab 12/13/15 2001 12/13/15 2010  NA 136 142  K 4.7 3.9  CL 107 104  CO2 22  --   GLUCOSE 100* 98  BUN 12 13  CREATININE 1.11 1.00  CALCIUM 9.2  --     Lipid Panel:     Component Value Date/Time   CHOL 140 12/14/2015 0535   TRIG 52 12/14/2015 0535   HDL 43 12/14/2015 0535   CHOLHDL 3.3 12/14/2015 0535   VLDL 10 12/14/2015 0535   LDLCALC 87 12/14/2015 0535   HgbA1c:  Lab Results  Component Value Date   HGBA1C 5.8* 12/14/2015   Urine Drug Screen:     Component Value Date/Time   LABOPIA NONE DETECTED 12/13/2015 2247   COCAINSCRNUR NONE DETECTED 12/13/2015 2247   LABBENZ NONE DETECTED 12/13/2015 2247   AMPHETMU NONE DETECTED 12/13/2015 2247   THCU NONE DETECTED 12/13/2015 2247   LABBARB NONE DETECTED 12/13/2015 2247      IMAGING I have personally reviewed the radiological images below and agree with the radiology interpretations.  Dg Chest 2 View 12/13/2015   No active cardiopulmonary disease.   Ct Head Wo Contrast  12/14/2015  12/13/2015   No acute intracranial findings.   CTA NECK  12/14/2015   1. No high-grade or critical stenosis  identified within the major arterial vasculature of the neck. 2. Minimal noncalcified atheromatous plaque about the right carotid bifurcation without stenosis. 3. Dominant left vertebral artery.   CTA HEAD  12/14/2015   1. No emergent large or proximal arterial branch occlusion within the intracranial circulation. No high-grade or correctable stenosis. 2. Atheromatous plaque within the cavernous ICAs with secondary mild diffuse narrowing (less than 25%). 3. Mild smooth narrowing of the distal left M1 segment. 4. Short-segment moderate stenosis within the distal left P2 segment, with mild atheromatous irregularity proximally.   Ct Angio Chest/abd/pel For Dissection W And/or W/wo 12/13/2015   No acute findings.  No evidence of thoracic aortic dissection. 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   EEG - This is a normal EEG for the patients stated age. There were no focal, hemispheric or lateralizing features. No epileptiform activity was recorded. A normal EEG does not exclude the diagnosis of a seizure disorder and if seizure remains high on the list of differential diagnosis, an ambulatory EEG may be of value. Clinical correlation is required.  Repeat CT head 12/14/2015 No intracranial hemorrhage. Chronic microvascular changes without CT evidence of large acute Infarct.  2D Echocardiogram  - Left ventricle: The cavity size was normal. Wall thickness was increased in a pattern of moderate LVH. Systolicunction as normal. The estimated ejection fraction was in the range of 60% to 65%. - Aortic valve: There was mild regurgitation.- Left ventricle: The cavity size was normal. Wall thickness was increased in a pattern of moderate LVH. Systolic function was normal. The estimated ejection fraction was in the range of 60%  to 65%. - Aortic valve: There was mild regurgitation.   PHYSICAL EXAM  Temp:  [97.7 F (36.5 C)-98.9 F (37.2 C)] 98.5 F (36.9 C) (05/24  1000) Pulse Rate:  [60-65] 65 (05/24 1000) Resp:  [15-18] 15 (05/24 1000) BP: (101-130)/(55-75) 122/64 mmHg (05/24 1000) SpO2:  [96 %-98 %] 97 % (05/24 1000) Weight:  [84.913 kg (187 lb 3.2 oz)] 84.913 kg (187 lb 3.2 oz) (05/23 2250)  General - Well nourished, well developed, in no apparent distress.  Ophthalmologic - Sharp disc margins OU.   Cardiovascular - Regular rate and rhythm with no murmur.  Mental Status -  Level of arousal and orientation to time, place, and person were intact. Language including expression, naming, repetition, comprehension was assessed and found intact. Fund of Knowledge was assessed and was intact.  Cranial Nerves II - XII - II - left hemianopia. However, able to track object at left visual side without difficulty.  III, IV, VI - Extraocular movements intact. V - Facial sensation intact bilaterally. VII - Facial movement intact bilaterally. VIII - Hearing & vestibular intact bilaterally. X - Palate elevates symmetrically. XI - Chin turning & shoulder shrug intact bilaterally. XII - Tongue protrusion intact.  Motor Strength - The patient's strength was 4+/5 LUE with drift, however no pronation, and also drift would stop with distraction, 4/5 LLE with positive hoover's sign. Bulk was normal and fasciculations were absent.   Motor Tone - Muscle tone was assessed at the neck and appendages and was normal.  Reflexes - The patient's reflexes were 1+ in all extremities and he had no pathological reflexes.  Sensory - Light touch, temperature/pinprick were assessed and were decreased on the left, with 30-40% comparing with right.    Coordination - The patient had normal movements in the hands with no ataxia or dysmetria.  Tremor was absent.  Gait and Station - not tested due to safety concerns post tPA.   ASSESSMENT/PLAN [name] is a 65 y.o. male with history of hypertension, hyperlipidemia, coronary artery disease, bradycardia with  pacemaker placement, and previous stroke presenting with recurrent syncopal spells, loss of vision on the left side and left face arm and leg numnbess. He  received IV t-PA 12/13/3015 at 2125.   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 Exam consistent with functional component.   Pt had admissions 4/26 in [place], [place] and 5/12 at [facility] for exact same presentation, received tPA on 4/26, but found both times having functional presentation. Noted that pt had similar admission past 1-2 years in multiple institution for exact same symptoms, all resolved fully.   MRI  / MRA  pacemaker  CTA head mild narrowing L M1, mod stenosis distal L P2  CTA neck no significant stenosis   CTA chest/pevlis/ abd no acute findings. Diverticulosis of colon. Likely renal cysts, mild ectasia of ascending thoracic aorta  Repeat CT head negative  2D Echo  EF 60-65%, No source of embolus  EEG normal  LDL 87  HgbA1c 5.8  SCDs for VTE prophylaxis Diet Heart Room service appropriate?: Yes; Fluid consistency:: Thin  aspirin 81 mg daily prior to admission, now on ASA 81mg   Ongoing aggressive stroke risk factor management  Therapy recommendations:  CIR but CIR not accepting, may need SNF  Disposition:  pending   Need psychology and psychiatry follow up  Hyperlipidemia  Home meds:  lipitor 80  LDL 87, goal < 70  Resumed lipitor   Continue statin at discharge  Other Stroke Risk Factors  Advanced age  Former Cigarette smoker  Coronary artery disease w/ stent 2012  Other Active Problems  Diastolic dysfunction  Symptomatic bradycardia s/p Pacemaker 2012  Pt denies recent stress  Need psychology and psychiatry follow up  Hospital day # 1   Marvel PlanJindong Kelsea Mousel, MD PhD Stroke Neurology 12/15/2015 12:18 PM   To contact Stroke Continuity provider, please refer to  [email or web address]. After hours, contact General Neurology

## 2015-12-15 NOTE — Evaluation (Signed)
Occupational Therapy Evaluation Patient Details Name: Jacob Pineda MRN: [id number] DOB: May 02, 1951 Today's Date: 12/15/2015    History of Present Illness 65 y.o. male history of hypertension, hyperlipidemia, coronary artery disease, bradycardia with pacemaker placement, diastolic dysfunction, and previous stroke, who presented with recurrent syncopal spells as well as loss of vision on the left side and numbness involving left face arm and leg. Code stroke was activated at [facility] ED. Patient was subsequently [facility] for [facility]. He reportedly experienced 2 episodes of sync[facility] CT scan of his head showed no acute intracranial abnormality. He complained of chest pain and also underwent CT angiogram of his chest which was unremarkable. Patient was deemed a candidate for intravenous thrombolytic therapy with TPA, which was administered.   Clinical Impression   Pt admitted for above. Pt independent with ADLs, PTA. Feel pt will benefit from acute OT to increase independence prior to d/c. Recommending CIR at this time for rehab.    Follow Up Recommendations  CIR    Equipment Recommendations  Other (comment) (defer to next venue)    Recommendations for Other Services Rehab consult     Precautions / Restrictions Precautions Precautions: Fall Restrictions Weight Bearing Restrictions: No      Mobility Bed Mobility               General bed mobility comments: not assessed  Transfers Overall transfer level: Needs assistance   Transfers: Sit to/from Stand Sit to Stand: Min guard              Balance    Unsteady with ambulation requiring assist from OT.                                        ADL Overall ADL's : Needs assistance/impaired                     Lower Body Dressing: Sit to/from stand;Minimal assistance   Toilet Transfer: Maximal assistance;Ambulation;RW (sit to stand from  chair; ambulated with and without RW)           Functional mobility during ADLs: Maximal assistance;Rolling walker (tried walking with and without RW) General ADL Comments: Encouraged pt to be using LUE for activities.      Vision Pt reports loss of peripheral vision in left field Vision Assessment?: Yes Visual Fields:  (did not report seeing anything on left side when OT tested)   Perception     Praxis      Pertinent Vitals/Pain Pain Assessment: 0-10 Pain Score: 4  Pain Location: LLE Pain Descriptors / Indicators: Throbbing Pain Intervention(s): Monitored during session     Hand Dominance Right   Extremity/Trunk Assessment Upper Extremity Assessment Upper Extremity Assessment: RUE deficits/detail;LUE deficits/detail RUE Deficits / Details: weakness in shoulder flexors RUE Coordination: decreased fine motor (difficulty reaching thumb to pinky) LUE Deficits / Details: weakness in shoulder flexors and grip strength; Lt shoulder flexors weaker than right LUE Sensation: decreased light touch LUE Coordination: decreased fine motor   Lower Extremity Assessment Lower Extremity Assessment: Defer to PT evaluation (weakness in LLE)       Communication Communication Communication: No difficulties   Cognition Arousal/Alertness: Awake/alert Behavior During Therapy: WFL for tasks assessed/performed Overall Cognitive Status: No family/caregiver present to determine baseline cognitive functioning (not oriented to day of the week)  General Comments       Exercises       Shoulder Instructions      Home Living Family/patient expects to be discharged to:: Inpatient rehab Living Arrangements: Alone   Type of Home: House Home Access: Stairs to enter Entrance Stairs-Number of Steps: 3 Entrance Stairs-Rails: None Home Layout: One level     Bathroom Shower/Tub: Chief Strategy Officer: Standard                Prior  Functioning/Environment Level of Independence: Independent             OT Diagnosis: Other (comment) (hemiparesis non-dominant side)   OT Problem List: Pain;Impaired UE functional use;Decreased coordination;Impaired sensation;Decreased knowledge of use of DME or AE;Decreased cognition;Impaired vision/perception;Impaired balance (sitting and/or standing);Decreased strength;Decreased range of motion   OT Treatment/Interventions: Self-care/ADL training;DME and/or AE instruction;Therapeutic activities;Cognitive remediation/compensation;Visual/perceptual remediation/compensation;Patient/family education;Balance training;Therapeutic exercise    OT Goals(Current goals can be found in the care plan section) Acute Rehab OT Goals Patient Stated Goal: not stated OT Goal Formulation: With patient Time For Goal Achievement: 12/22/15 Potential to Achieve Goals: Good ADL Goals Pt Will Perform Grooming: with set-up;with supervision;standing Pt Will Perform Lower Body Dressing: sit to/from stand;with min guard assist Pt Will Transfer to Toilet: with min guard assist;ambulating;bedside commode Pt Will Perform Toileting - Clothing Manipulation and hygiene: with min guard assist;sit to/from stand Additional ADL Goal #1: Pt will independently perform HEP for LUE to increase strength and coordination.  OT Frequency: Min 2X/week   Barriers to D/C:            Co-evaluation              End of Session Equipment Utilized During Treatment: Gait belt;Rolling walker Nurse Communication: Mobility status  Activity Tolerance: Patient tolerated treatment well Patient left: in chair;with call bell/phone within reach;with chair alarm set   Time: 1610-9604 OT Time Calculation (min): 13 min Charges:  OT General Charges $OT Visit: 1 Procedure OT Evaluation $OT Eval Moderate Complexity: 1 Procedure G-CodesEarlie Raveling OTR/L [phone] 12/15/2015, 11:40 AM

## 2015-12-15 NOTE — Evaluation (Signed)
Physical Therapy Evaluation Patient Details Name: Jacob Pineda MRN: [id number] DOB: [birth year] Today's Date: 12/15/2015   History of Present Illness  65 y.o. male history of hypertension, hyperlipidemia, coronary artery disease, bradycardia with pacemaker placement, diastolic dysfunction, and previous stroke, who presented with recurrent syncopal spells as well as loss of vision on the left side and numbness involving left face arm and leg. Code stroke was activated at [facility]. [facility]. He reportedly experienced 2 [facility] of [facility] CT scan of his head showed no acute intracranial abnormality. He complained of chest pain and also underwent CT angiogram of his chest which was unremarkable. Patient was deemed a candidate for intravenous thrombolytic therapy with TPA, which was administered.  Clinical Impression  Pt admitted with above diagnosis. Pt currently with functional limitations due to the deficits listed below (see PT Problem List). Pt ambulated 25' and 20' with RW and consistent min A with several episodes of LOB due to weakness LLE.  Pt will benefit from skilled PT to increase their independence and safety with mobility to allow discharge to the venue listed below.       Follow Up Recommendations CIR    Equipment Recommendations  Rolling walker with 5" wheels    Recommendations for Other Services Rehab consult     Precautions / Restrictions Precautions Precautions: Fall Restrictions Weight Bearing Restrictions: No      Mobility  Bed Mobility               General bed mobility comments: pt received in chair  Transfers Overall transfer level: Needs assistance Equipment used: Rolling walker (2 wheeled) Transfers: Sit to/from Stand Sit to Stand: Min guard         General transfer comment: pt slightly dizzy with initial standing and then reported as he fatigued as  well, described as feeling like he would pass out. Min guard for safety  Ambulation/Gait Ambulation/Gait assistance: Min assist Ambulation Distance (Feet): 45 Feet (25', 20') Assistive device: Rolling walker (2 wheeled) Gait Pattern/deviations: Decreased weight shift to left;Decreased stance time - left Gait velocity: decreased Gait velocity interpretation: <1.8 ft/sec, indicative of risk for recurrent falls General Gait Details: pt was educated on taking wt through UE's on RW after he mentioned that left knee has been buckling. No buckling first 10' but after that left knee did not really buckle as much as have a delayed step when in terminal stance phase. Pt did follow instructions and take wt through UE's at those times and self corrected with min A for support. Pt had to sit rather suddenly at 2525' and then after 20' second bout. His feeling that he would pass out persisted throughout  Information systems managertairs            Wheelchair Mobility    Modified Rankin (Stroke Patients Only)       Balance Overall balance assessment: Needs assistance   Sitting balance-Leahy Scale: Good     Standing balance support: Bilateral upper extremity supported Standing balance-Leahy Scale: Poor                               Pertinent Vitals/Pain Pain Assessment: Faces Pain Score: 4  Faces Pain Scale: Hurts little more Pain Location: LLE Pain Descriptors / Indicators: Heaviness Pain Intervention(s): Limited activity within patient's tolerance    Home Living Family/patient expects to be discharged to:: Inpatient  rehab Living Arrangements: Alone   Type of Home: House Home Access: Stairs to enter Entrance Stairs-Rails: None Entrance Stairs-Number of Steps: 3 Home Layout: One level   Additional Comments: is a widower and lost a daughter as well, does not have support    Prior Function Level of Independence: Independent         Comments: 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     Hand Dominance   Dominant Hand: Right    Extremity/Trunk Assessment   Upper Extremity Assessment: Defer to OT evaluation RUE Deficits / Details: weakness in shoulder flexors     LUE Deficits / Details: weakness in shoulder flexors and grip strength; Lt shoulder flexors weaker than right   Lower Extremity Assessment: LLE deficits/detail;Generalized weakness   LLE Deficits / Details: ankle pf/df 3/5, knee ext 3/5, knee flex 3/5, hip flex 3/5, pt has difficulty sustaining contraction, especially in WB'ing  Cervical / Trunk Assessment: Normal  Communication   Communication: No difficulties  Cognition Arousal/Alertness: Awake/alert Behavior During Therapy: WFL for tasks assessed/performed Overall Cognitive Status: No family/caregiver present to determine baseline cognitive functioning (not oriented to date or month)       Memory: Decreased short-term memory              General Comments      Exercises        Assessment/Plan    PT Assessment Patient needs continued PT services  PT Diagnosis Abnormality of gait;Difficulty walking;Generalized weakness;Acute pain;Altered mental status   PT Problem List Decreased strength;Decreased activity tolerance;Decreased balance;Decreased mobility;Decreased coordination;Decreased cognition;Decreased knowledge of use of DME;Decreased knowledge of precautions;Pain;Impaired sensation  PT Treatment Interventions DME instruction;Gait training;Functional mobility training;Therapeutic activities;Therapeutic exercise;Stair training;Balance training;Neuromuscular re-education;Cognitive remediation;Patient/family education   PT Goals (Current goals can be found in the Care Plan section) Acute Rehab PT Goals Patient Stated Goal: get better, get back to work PT Goal Formulation: With patient Time For Goal Achievement: 12/29/15 Potential to Achieve Goals: Good    Frequency Min 4X/week    Barriers to discharge Decreased caregiver support lives alone but anticipate that he could reach mod I    Co-evaluation               End of Session Equipment Utilized During Treatment: Gait belt Activity Tolerance: Patient tolerated treatment well Patient left: in chair;with call bell/phone within reach;with chair alarm set Nurse Communication: Mobility status         Time: 2956-2130 PT Time Calculation (min) (ACUTE ONLY): 25 min   Charges:   PT Evaluation $PT Eval Moderate Complexity: 1 Procedure PT Treatments $Gait Training: 8-22 mins   PT G Codes:      Lyanne Co, PT  Acute Rehab Services  [phone]   [place], [place] 12/15/2015, 1:57 PM

## 2015-12-15 NOTE — NC FL2 (Signed)
[place] MEDICAID FL2 LEVEL OF CARE SCREENING TOOL     IDENTIFICATION  Patient Name: Jacob Pineda Birthdate: 02/19/1951 Sex: male Admission Date (Current Location): 12/13/2015  [facility] and [place] Number:  Producer, television/film/video and Address:  [facility], [address] [place], [place] [address]      Provider Number: [id number]  Attending Physician Name and Address:  Jacob Plan, MD  Relative Name and Phone Number:       Current Level of Care: Hospital Recommended Level of Care: Skilled Nursing Facility Prior Approval Number:    Date Approved/Denied:   PASRR Number: [id number]  Discharge Pineda: SNF    Current Diagnoses: Patient Active Problem List   Diagnosis Date Noted  . CVA (cerebral infarction) 12/14/2015  . Faintness   . Right-sided chest wall pain 12/26/2014  . 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 Dysarthria 12/26/2014  . Stroke (HCC)   . Diastolic dysfunction   . Pacemaker   . Pain in the chest   . Headache   . Chronic diastolic heart failure (HCC)   . Dizziness 07/26/2013  . Syncope 07/25/2013  . History of bradycardia 07/25/2013  . CAD (coronary artery disease) 07/25/2013  . Unstable angina (HCC) 07/24/2013    Orientation RESPIRATION BLADDER Height & Weight     Self, Time, Situation, Place  Normal Continent Weight: 187 lb 3.2 oz (84.913 kg) Height:   (180.3 cm)  BEHAVIORAL SYMPTOMS/MOOD NEUROLOGICAL BOWEL NUTRITION STATUS      Continent Diet (cardiac)  AMBULATORY STATUS COMMUNICATION OF NEEDS Skin   Extensive Assist Verbally Normal                       Personal Care Assistance Level of Assistance  Bathing, Dressing Bathing Assistance: Maximum assistance   Dressing Assistance: Maximum assistance     Functional Limitations Info             SPECIAL CARE FACTORS FREQUENCY  PT (By licensed PT), OT (By licensed OT)     PT Frequency: 5/wk OT Frequency: 5/wk            Contractures      Additional Factors Info  Code Status, Allergies Code Status Info: FULL Allergies Info: [id number] A           Current Medications (12/15/2015):  This is the current hospital active medication list Current Facility-Administered Medications  Medication Dose Route Frequency Provider Last Rate Last Dose  . acetaminophen (TYLENOL) tablet 650 mg  650 mg Oral Q4H PRN Jacob Pineda      . aspirin chewable tablet 81 mg  81 mg Oral Daily Jacob Plan, MD   81 mg at 12/15/15 1009  . atorvastatin (LIPITOR) tablet 80 mg  80 mg Oral q1800 Jacob Plan, MD   80 mg at 12/14/15 1714  . labetalol (NORMODYNE,TRANDATE) injection 10 mg  10 mg Intravenous Q2H PRN Jacob Plan, MD      . multivitamin with minerals tablet 1 tablet  1 tablet Oral Daily Jacob Plan, MD   1 tablet at 12/15/15 1009  . nitroGLYCERIN (NITROSTAT) SL tablet 0.4 mg  0.4 mg Sublingual Q5 min PRN Jacob Tripp Dowless, PA-C      . ondansetron (ZOFRAN) injection 4 mg  4 mg Intravenous Q4H PRN Jacob Brock, MD   4 mg at 12/14/15 1714     Discharge Medications: Please see discharge summary for a list of discharge medications.  Relevant Imaging Results:  Relevant Lab Results:   Additional Information SS#: [id number]  Jacob Pineda, Jacob Pineda, [place]

## 2015-12-16 NOTE — Care Management Important Message (Signed)
Important Message  Patient Details  Name: Clarisa SchoolsDarrell W Ehlert MRN: [id number] Date of Birth: 06/16/1951   Medicare Important Message Given:  Yes    Jonquil Stubbe, Stephan MinisterSusan Coleman 12/16/2015, 12:22 PM

## 2015-12-16 NOTE — Progress Notes (Signed)
Hourly rounding performed. Call light within reach. Pt in no acute distress. Denies needs. Pt more content now that he has eaten a snack of cereal.

## 2015-12-16 NOTE — Progress Notes (Signed)
Physical Therapy Treatment Patient Details Name: Jacob SchoolsDarrell W Mikami MRN: [id number] DOB: [birth year] Today's Date: 12/16/2015    History of Present Illness 65 y.o. male history of hypertension, hyperlipidemia, coronary artery disease, bradycardia with pacemaker placement, diastolic dysfunction, and previous stroke, who presented with recurrent syncopal spells as well as loss of vision on the left side and numbness involving left face arm and leg. Code stroke was activated at [facility]. [facility]. He reportedly experienced 2 [facility] of [facility]. CT scan of his head showed no acute intracranial abnormality. He complained of chest pain and also underwent CT angiogram of his chest which was unremarkable. Patient was deemed a candidate for intravenous thrombolytic therapy with TPA, which was administered.    PT Comments    Pt continues to demo L sided weakness and gait deviations requiring Min A for safe ambulation. Pt with need for rest breaks due to feeling faint. VSS in sitting but unable to take standing BP this session. Current plan remains appropriate.   Follow Up Recommendations  CIR     Equipment Recommendations  Rolling walker with 5" wheels    Recommendations for Other Services Rehab consult     Precautions / Restrictions Precautions Precautions: Fall Restrictions Weight Bearing Restrictions: No    Mobility  Bed Mobility Overal bed mobility: Needs Assistance Bed Mobility: Supine to Sit     Supine to sit: Supervision     General bed mobility comments: increased time needed but no physical assist  Transfers Overall transfer level: Needs assistance Equipment used: Rolling walker (2 wheeled) Transfers: Sit to/from Stand Sit to Stand: Min guard         General transfer comment: min guard for safety; pt c/o dizziness upon standing and reported that it does not go away when OOB and  that it is worse when standing than sitting  Ambulation/Gait Ambulation/Gait assistance: Min assist Ambulation Distance (Feet): 70 Feet (with 4 seated rest breaks) Assistive device: Rolling walker (2 wheeled) Gait Pattern/deviations: Step-through pattern;Decreased stance time - left;Decreased weight shift to left Gait velocity: decreased   General Gait Details: pt required 4 seated rest breaks due to feeling faint; pt with L side weakness and difficulty advancing L LE forward in terminal phase with pt reporting his knee buckled although there was no knee buckling just instabiltiy when pt attempted to bring L LE forward; min A to recover from instability with cues for position of RW and weight bearing through bilat UE   Stairs            Wheelchair Mobility    Modified Rankin (Stroke Patients Only)       Balance Overall balance assessment: Needs assistance   Sitting balance-Leahy Scale: Good     Standing balance support: Bilateral upper extremity supported Standing balance-Leahy Scale: Poor                      Cognition Arousal/Alertness: Awake/alert Behavior During Therapy: WFL for tasks assessed/performed Overall Cognitive Status: Within Functional Limits for tasks assessed                      Exercises      General Comments        Pertinent Vitals/Pain Pain Assessment: Faces Faces Pain Scale: Hurts a little bit Pain Location: L LE Pain Descriptors / Indicators: Heaviness Pain Intervention(s): Monitored during session    Home Living  Prior Function            PT Goals (current goals can now be found in the care plan section) Acute Rehab PT Goals Patient Stated Goal: get better, get back to work PT Goal Formulation: With patient Time For Goal Achievement: 12/29/15 Potential to Achieve Goals: Good Progress towards PT goals: Progressing toward goals    Frequency  Min 4X/week    PT Plan Current plan  remains appropriate    Co-evaluation             End of Session Equipment Utilized During Treatment: Gait belt Activity Tolerance: Patient tolerated treatment well Patient left: in chair;with call bell/phone within reach;with chair alarm set     Time: 0851-0920 PT Time Calculation (min) (ACUTE ONLY): 29 min  Charges:  $Gait Training: 8-22 mins $Therapeutic Activity: 8-22 mins                    G Codes:      Derek Mound, PTA Pager: [phone]   12/16/2015, 12:14 PM

## 2015-12-16 NOTE — Clinical Social Work Note (Signed)
CSW presented bed offers to patient and he would like to go [facility].  [facility] and will contact CSW if they are able to accept patient.  CSW awaiting call back from [facility], CSW continuing to follow patient's progress throughout discharge planning.  Ervin KnackEric R. Lenoard Helbert, MSW, Theresia MajorsLCSWA [phone] 12/16/2015 5:16 PM

## 2015-12-16 NOTE — Progress Notes (Signed)
STROKE TEAM PROGRESS NOTE   SUBJECTIVE (INTERVAL HISTORY) No family at bedside. Pt said he is doing better with PT but still has left leg buckle while walking. Prefer SNF for rehab.    OBJECTIVE Temp:  [97.9 F (36.6 C)-98.7 F (37.1 C)] 97.9 F (36.6 C) (05/25 0952) Pulse Rate:  [61-79] 62 (05/25 0952) Cardiac Rhythm:  [-] Atrial paced (05/24 1900) Resp:  [16-20] 20 (05/25 0952) BP: (114-139)/(67-72) 115/68 mmHg (05/25 0952) SpO2:  [96 %-99 %] 99 % (05/25 0952)  CBC:   Recent Labs Lab 12/13/15 2001 12/13/15 2010  WBC 7.4  --   NEUTROABS 4.5  --   HGB 13.3 14.6  HCT 40.8 43.0  MCV 91.9  --   PLT 202  --     Basic Metabolic Panel:   Recent Labs Lab 12/13/15 2001 12/13/15 2010  NA 136 142  K 4.7 3.9  CL 107 104  CO2 22  --   GLUCOSE 100* 98  BUN 12 13  CREATININE 1.11 1.00  CALCIUM 9.2  --     Lipid Panel:     Component Value Date/Time   CHOL 140 12/14/2015 0535   TRIG 52 12/14/2015 0535   HDL 43 12/14/2015 0535   CHOLHDL 3.3 12/14/2015 0535   VLDL 10 12/14/2015 0535   LDLCALC 87 12/14/2015 0535   HgbA1c:  Lab Results  Component Value Date   HGBA1C 5.8* 12/14/2015   Urine Drug Screen:     Component Value Date/Time   LABOPIA NONE DETECTED 12/13/2015 2247   COCAINSCRNUR NONE DETECTED 12/13/2015 2247   LABBENZ NONE DETECTED 12/13/2015 2247   AMPHETMU NONE DETECTED 12/13/2015 2247   THCU NONE DETECTED 12/13/2015 2247   LABBARB NONE DETECTED 12/13/2015 2247      IMAGING I have personally reviewed the radiological images below and agree with the radiology interpretations.  Dg Chest 2 View 12/13/2015   No active cardiopulmonary disease.   Ct Head Wo Contrast  12/14/2015  12/13/2015   No acute intracranial findings.   CTA NECK  12/14/2015   1. No high-grade or critical stenosis identified within the major arterial vasculature of the neck. 2. Minimal noncalcified atheromatous plaque about the right carotid bifurcation without stenosis. 3. Dominant  left vertebral artery.   CTA HEAD  12/14/2015   1. No emergent large or proximal arterial branch occlusion within the intracranial circulation. No high-grade or correctable stenosis. 2. Atheromatous plaque within the cavernous ICAs with secondary mild diffuse narrowing (less than 25%). 3. Mild smooth narrowing of the distal left M1 segment. 4. Short-segment moderate stenosis within the distal left P2 segment, with mild atheromatous irregularity proximally.   Ct Angio Chest/abd/pel For Dissection W And/or W/wo 12/13/2015   No acute findings.  No evidence of thoracic aortic dissection. 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   EEG - This is a normal EEG for the patients stated age. There were no focal, hemispheric or lateralizing features. No epileptiform activity was recorded. A normal EEG does not exclude the diagnosis of a seizure disorder and if seizure remains high on the list of differential diagnosis, an ambulatory EEG may be of value. Clinical correlation is required.  Repeat CT head 12/14/2015 No intracranial hemorrhage. Chronic microvascular changes without CT evidence of large acute Infarct.  2D Echocardiogram  - Left ventricle: The cavity size was normal. Wall thickness was increased in a pattern of moderate LVH. Systolicunction as normal. The estimated ejection fraction was in  the range of 60% to 65%. - Aortic valve: There was mild regurgitation.- Left ventricle: The cavity size was normal. Wall thickness was increased in a pattern of moderate LVH. Systolic function was normal. The estimated ejection fraction was in the range of 60%  to 65%. - Aortic valve: There was mild regurgitation.   PHYSICAL EXAM  Temp:  [97.9 F (36.6 C)-98.7 F (37.1 C)] 97.9 F (36.6 C) (05/25 0952) Pulse Rate:  [61-79] 62 (05/25 0952) Resp:  [16-20] 20 (05/25 0952) BP: (114-139)/(67-72) 115/68 mmHg (05/25 0952) SpO2:  [96 %-99 %] 99 % (05/25 0952)  General -  Well nourished, well developed, in no apparent distress.  Ophthalmologic - Sharp disc margins OU.   Cardiovascular - Regular rate and rhythm with no murmur.  Mental Status -  Level of arousal and orientation to time, place, and person were intact. Language including expression, naming, repetition, comprehension was assessed and found intact. Fund of Knowledge was assessed and was intact.  Cranial Nerves II - XII - II - left hemianopia. However, able to track object at left visual side without difficulty.  III, IV, VI - Extraocular movements intact. V - Facial sensation intact bilaterally. VII - Facial movement intact bilaterally. VIII - Hearing & vestibular intact bilaterally. X - Palate elevates symmetrically. XI - Chin turning & shoulder shrug intact bilaterally. XII - Tongue protrusion intact.  Motor Strength - The patient's strength was 4+/5 LUE with drift, however no pronation, and also drift would stop with distraction, 4/5 LLE with positive hoover's sign. Bulk was normal and fasciculations were absent.   Motor Tone - Muscle tone was assessed at the neck and appendages and was normal.  Reflexes - The patient's reflexes were 1+ in all extremities and he had no pathological reflexes.  Sensory - Light touch, temperature/pinprick were assessed and were decreased on the left, with 30-40% comparing with right.    Coordination - The patient had normal movements in the hands with no ataxia or dysmetria.  Tremor was absent.  Gait and Station - not tested due to safety concerns post tPA.   ASSESSMENT/PLAN Jacob Pineda is a 65 y.o. male with history of hypertension, hyperlipidemia, coronary artery disease, bradycardia with pacemaker placement, and previous stroke presenting with recurrent syncopal spells, loss of vision on the left side and left face arm and leg numnbess. He  received IV t-PA 12/13/3015 at 2125.   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 Exam consistent with functional component.   Pt had admissions 4/26 in [place], [place] and 5/12 at [facility] for exact same presentation, received tPA on 4/26, but found both times having functional presentation. Noted that pt had similar admission past 1-2 years in multiple institution for exact same symptoms, all resolved fully.   MRI  / MRA  pacemaker  CTA head mild narrowing L M1, mod stenosis distal L P2  CTA neck no significant stenosis   CTA chest/pevlis/ abd no acute findings. Diverticulosis of colon. Likely renal cysts, mild ectasia of ascending thoracic aorta  Repeat CT head negative  2D Echo  EF 60-65%, No source of embolus   EEG normal  LDL 87  HgbA1c 5.8  SCDs for VTE prophylaxis Diet Heart Room service appropriate?: Yes; Fluid consistency:: Thin  aspirin 81 mg daily prior to admission, now on ASA   Ongoing aggressive stroke risk factor management  Therapy recommendations:  SNF  Disposition:  pending   Psychiatry consult requested while as inpt  Need psychology and psychiatry follow up  Hyperlipidemia  Home meds:  lipitor 80  LDL 87, goal < 70  Resumed lipitor   Continue statin at discharge  Other Stroke Risk Factors  Advanced age  Former Cigarette smoker  Coronary artery disease w/ stent 2012  Other Active Problems  Diastolic dysfunction  Symptomatic bradycardia s/p Pacemaker 2012  Pt denies recent stress  Need psychology and psychiatry follow up  Hospital day # 2   Marvel Plan, MD PhD Stroke Neurology 12/16/2015 10:17 AM   To contact Stroke Continuity provider, please refer to [email or web address]. After hours, contact General Neurology

## 2015-12-17 DIAGNOSIS — E785 Hyperlipidemia, unspecified: Secondary | ICD-10-CM | POA: Diagnosis present

## 2015-12-17 DIAGNOSIS — F4323 Adjustment disorder with mixed anxiety and depressed mood: Secondary | ICD-10-CM | POA: Diagnosis present

## 2015-12-17 DIAGNOSIS — I639 Cerebral infarction, unspecified: Secondary | ICD-10-CM

## 2015-12-17 MED ORDER — ONDANSETRON HCL 4 MG PO TABS: 8.0000 mg | ORAL_TABLET | Freq: Three times a day (TID) | ORAL | Status: DC | PRN

## 2015-12-17 NOTE — Progress Notes (Signed)
Occupational Therapy Treatment Patient Details Name: Jacob Pineda MRN: [id number] DOB: 01-15-1951 Today's Date: 12/17/2015    History of present illness 65 y.o. male history of hypertension, hyperlipidemia, coronary artery disease, bradycardia with pacemaker placement, diastolic dysfunction, and previous stroke, who presented with recurrent syncopal spells as well as loss of vision on the left side and numbness involving left face arm and leg. Code stroke was activated at [facility] ED. Patient was subsequently transferred to [facility] for further management. He reportedly experienced 2 episodes of syncope at [facility] ED. CT scan of his head showed no acute intracranial abnormality. He complained of chest pain and also underwent CT angiogram of his chest which was unremarkable. Patient was deemed a candidate for intravenous thrombolytic therapy with TPA, which was administered.   OT comments  Pt progressing well with therapy this session and able to complete toilet transfer. Pt did report feeling fatigue in BIL LE and need to return to sitting at end of session. Pt demonstrates activity tolerance deficits.   Follow Up Recommendations  SNF    Equipment Recommendations  Other (comment) (defer to SNF- RW)    Recommendations for Other Services      Precautions / Restrictions Precautions Precautions: Fall       Mobility Bed Mobility               General bed mobility comments: in chair on arrival  Transfers Overall transfer level: Needs assistance   Transfers: Sit to/from Stand Sit to Stand: Supervision              Balance Overall balance assessment: Needs assistance   Sitting balance-Leahy Scale: Good                             ADL Overall ADL's : Needs assistance/impaired                         Toilet Transfer: Supervision/safety;RW;BSC           Functional mobility during ADLs: Supervision/safety;Rolling  walker General ADL Comments: pt able to static stand during task for > 5 minutes but with bil Ue support on RW. pt very interested in learning more about [facility] SNF       Vision                     Perception     Praxis      Cognition   Behavior During Therapy: [facility] for tasks assessed/performed Overall Cognitive Status: Within Functional Limits for tasks assessed                       Extremity/Trunk Assessment               Exercises     Shoulder Instructions       General Comments      Pertinent Vitals/ Pain       Pain Assessment: No/denies pain  Home Living                                          Prior Functioning/Environment              Frequency Min 2X/week     Progress Toward Goals  OT Goals(current goals can now be found in  the care plan section)  Progress towards OT goals: Progressing toward goals  Acute Rehab OT Goals Patient Stated Goal: get better, get back to work OT Goal Formulation: With patient Time For Goal Achievement: 12/22/15 Potential to Achieve Goals: Good ADL Goals Pt Will Perform Grooming: with set-up;with supervision;standing Pt Will Perform Lower Body Dressing: sit to/from stand;with min guard assist Pt Will Transfer to Toilet: with min guard assist;ambulating;bedside commode (MET) Pt Will Perform Toileting - Clothing Manipulation and hygiene: with min guard assist;sit to/from stand (MET) Additional ADL Goal #1: Pt will independently perform HEP for LUE to increase strength and coordination.  Plan Discharge plan remains appropriate    Co-evaluation                 End of Session Equipment Utilized During Treatment: Rolling walker;Gait belt   Activity Tolerance Patient tolerated treatment well   Patient Left in chair;with call bell/phone within reach;with bed alarm set   Nurse Communication Mobility status;Precautions        Time: 5208-0223 OT Time Calculation (min):  12 min  Charges: OT General Charges $OT Visit: 1 Procedure OT Treatments $Self Care/Home Management : 8-22 mins  Parke Poisson B 12/17/2015, 2:49 PM  Jeri Modena   OTR/L Pager: [phone] Office: [phone] .

## 2015-12-17 NOTE — Care Management Note (Signed)
Case Management Note  Patient Details  Name: Jacob Pineda MRN: [id number] Date of Birth: 1950/08/02  Subjective/Objective:                    Action/Plan: Patient discharging to SNF today. No further needs per CM.   Expected Discharge Date:  12/17/15               Expected Discharge Plan:  Skilled Nursing Facility  In-House Referral:  Clinical Social Work  Discharge planning Services  CM Consult  Post Acute Care Choice:    Choice offered to:     DME Arranged:    DME Agency:     HH Arranged:    HH Agency:     Status of Service:  Completed, signed off  Medicare Important Message Given:  Yes Date Medicare IM Given:    Medicare IM give by:    Date Additional Medicare IM Given:    Additional Medicare Important Message give by:     If discussed at Long Length of Stay Meetings, dates discussed:    Additional Comments:  Jacob BaloKelli F [name], RN 12/17/2015, 3:50 PM

## 2015-12-17 NOTE — Discharge Summary (Signed)
Stroke Discharge Summary  Patient ID: Jacob Pineda   MRN: [id number]      DOB: [birth year]  Date of Admission: 12/13/2015 Date of Discharge: 12/17/2015  Attending Physician:  Marvel Plan, MD, Stroke MD Consulting Physician(s):   Leata Mouse, MD (psychiatry)    Patient's PCP:  Default, Provider, MD  DISCHARGE DIAGNOSIS:  Principal Problem:   Adjustment disorder with mixed anxiety and depressed mood Active Problems:   CAD (coronary artery disease)   Pacemaker   Chronic diastolic heart failure (HCC)   Stroke-like episode (HCC) s/p IV tPA   Faintness   Hyperlipidemia  BMI: Body mass index is 26.12 kg/(m^2).  Past Medical History  Diagnosis Date  . Coronary artery disease   . Pacemaker   . Diastolic dysfunction   . Stroke [facility])    Past Surgical History  Procedure Laterality Date  . Pacemaker insertion    . Cardiac stents times 2    . Cholecystectomy    . Left heart catheterization with coronary angiogram N/A 07/25/2013    Procedure: LEFT HEART CATHETERIZATION WITH CORONARY ANGIOGRAM;  Surgeon: Laurey Morale, MD;  Location: [facility] CATH LAB;  Service: Cardiovascular;  Laterality: N/A;      Medication List    STOP taking these medications        metoprolol tartrate 12.5 mg Tabs tablet  Commonly known as:  LOPRESSOR      TAKE these medications        acetaminophen 500 MG tablet  Commonly known as:  TYLENOL  Take 1,000 mg by mouth daily as needed for mild pain.     aspirin EC 81 MG tablet  Take 81 mg by mouth daily.     atorvastatin 80 MG tablet  Commonly known as:  LIPITOR  Take 1 tablet (80 mg total) by mouth daily at 6 PM.     multivitamin with minerals Tabs tablet  Take 1 tablet by mouth daily.        LABORATORY STUDIES CBC    Component Value Date/Time   WBC 7.4 12/13/2015 2001   RBC 4.44 12/13/2015 2001   HGB 14.6 12/13/2015 2010   HCT 43.0 12/13/2015 2010   PLT 202 12/13/2015 2001   MCV 91.9 12/13/2015 2001   MCH 30.0 12/13/2015  2001   MCHC 32.6 12/13/2015 2001   RDW 14.3 12/13/2015 2001   LYMPHSABS 1.8 12/13/2015 2001   MONOABS 0.9 12/13/2015 2001   EOSABS 0.1 12/13/2015 2001   BASOSABS 0.0 12/13/2015 2001   CMP    Component Value Date/Time   NA 142 12/13/2015 2010   K 3.9 12/13/2015 2010   CL 104 12/13/2015 2010   CO2 22 12/13/2015 2001   GLUCOSE 98 12/13/2015 2010   BUN 13 12/13/2015 2010   CREATININE 1.00 12/13/2015 2010   CALCIUM 9.2 12/13/2015 2001   PROT 6.2 07/25/2013 0230   ALBUMIN 3.2* 07/25/2013 0230   AST 14 07/25/2013 0230   ALT 9 07/25/2013 0230   ALKPHOS 56 07/25/2013 0230   BILITOT 0.4 07/25/2013 0230   GFRNONAA >60 12/13/2015 2001   GFRAA >60 12/13/2015 2001   COAGS Lab Results  Component Value Date   INR 0.94 07/25/2013   Lipid Panel    Component Value Date/Time   CHOL 140 12/14/2015 0535   TRIG 52 12/14/2015 0535   HDL 43 12/14/2015 0535   CHOLHDL 3.3 12/14/2015 0535   VLDL 10 12/14/2015 0535   LDLCALC 87 12/14/2015 0535   HgbA1C  Lab Results  Component Value Date   HGBA1C 5.8* 12/14/2015   Cardiac Panel (last 3 results) No results for input(s): CKTOTAL, CKMB, TROPONINI, RELINDX in the last 72 hours. Urinalysis    Component Value Date/Time   COLORURINE YELLOW 12/13/2015 2236   APPEARANCEUR CLEAR 12/13/2015 2236   LABSPEC 1.020 12/13/2015 2236   PHURINE 6.5 12/13/2015 2236   GLUCOSEU NEGATIVE 12/13/2015 2236   HGBUR TRACE* 12/13/2015 2236   BILIRUBINUR NEGATIVE 12/13/2015 2236   KETONESUR NEGATIVE 12/13/2015 2236   PROTEINUR NEGATIVE 12/13/2015 2236   NITRITE NEGATIVE 12/13/2015 2236   LEUKOCYTESUR NEGATIVE 12/13/2015 2236   Urine Drug Screen     Component Value Date/Time   LABOPIA NONE DETECTED 12/13/2015 2247   COCAINSCRNUR NONE DETECTED 12/13/2015 2247   LABBENZ NONE DETECTED 12/13/2015 2247   AMPHETMU NONE DETECTED 12/13/2015 2247   THCU NONE DETECTED 12/13/2015 2247   LABBARB NONE DETECTED 12/13/2015 2247    Alcohol Level    Component Value  Date/Time   ETH <5 12/13/2015 2001    SIGNIFICANT DIAGNOSTIC STUDIES I have personally reviewed the radiological images below and agree with the radiology interpretations.  Ct Head Wo Contrast  12/14/2015  No intracranial hemorrhage. Chronic microvascular changes without CT evidence of large acute Infarct. 12/13/2015 No acute intracranial findings.   CTA NECK  12/14/2015 1. No high-grade or critical stenosis identified within the major arterial vasculature of the neck. 2. Minimal noncalcified atheromatous plaque about the right carotid bifurcation without stenosis. 3. Dominant left vertebral artery.   CTA HEAD  12/14/2015 1. No emergent large or proximal arterial branch occlusion within the intracranial circulation. No high-grade or correctable stenosis. 2. Atheromatous plaque within the cavernous ICAs with secondary mild diffuse narrowing (less than 25%). 3. Mild smooth narrowing of the distal left M1 segment. 4. Short-segment moderate stenosis within the distal left P2 segment, with mild atheromatous irregularity proximally.   2D Echocardiogram  - Left ventricle: The cavity size was normal. Wall thickness was increased in a pattern of moderate LVH. Systolicunction as normal. The estimated ejection fraction was in the range of 60% to 65%. - Aortic valve: There was mild regurgitation.- Left ventricle: The cavity size was normal. Wall thickness was increased in a pattern of moderate LVH. Systolic function was normal. The estimated ejection fraction was in the range of 60% to 65%. - Aortic valve: There was mild regurgitation.  EEG - This is a normal EEG for the patients stated age. There were no focal, hemispheric or lateralizing features. No epileptiform activity was recorded. A normal EEG does not exclude the diagnosis of a seizure disorder and if seizure remains high on the list of differential diagnosis, an ambulatory EEG may be of value. Clinical correlation is required.  Dg  Chest 2 View 12/13/2015 No active cardiopulmonary disease.   Ct Angio Chest/abd/pel For Dissection W And/or W/wo 12/13/2015 No acute findings. No evidence of thoracic aortic dissection. 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      HISTORY OF PRESENT ILLNESS Clarisa SchoolsDarrell W Cizek is an 65 y.o. male history of hypertension, hyperlipidemia, coronary artery disease, bradycardia with pacemaker placement, and previous stroke, presenting with recurrent syncopal spells as well as loss of vision on the left side and numbness involving left face arm and leg. Code stroke [facility] activated [facility]. Patient was subsequently [facility] for [facility] management. He reportedly experienced 2 [facility]. CT scan of his  head showed no acute intracranial abnormality. He complained of chest pain and also underwent CT angiogram of his chest which was unremarkable. NIH stroke score was 5. Patient was deemed a candidate for intravenous thrombolytic therapy with TPA, which was administered. Patient was subsequently admitted to neuro intensive care unit for further management. He was LKW at 5:00 PM on 12/13/2015.   He had almost exactly same story on 11/17/2015 and got tPA in [place], [place]. As per the report "Patient is a 65 y.o. Caucasian male who presented on 4/16 with left sided weakness and vision loss that had started earlier that day. Patient noted what sounds like a syncopal event around 11AM, after which he started to experience the neurologic symptoms. Patient with CT head that did not show any acute abnormalities but NIHSS 5-6 so patient given TPA. Lipid profile showed LDL 101 with normal cholesterol and trig. A1c 5.4. Patient with obvious left sided weakness but questionable whether he is embellishing his symptoms as they appear to improve with distraction. Symptoms continue this morning but do appear to be mildly improved from  yesterday. Plan of care reviewed with Dr. Napoleon Form." "Clinical stroke syndrome although CT negative. Unable to obtain MRI d/t pacer. CTA head/neck personally reviewed and are negative." "Pt states that prior to his L sided weakness and visual field changes he felt dizzy and lost consciousness. He lives alone but knows this event lasted a few min because he started warming water in a kettle and when he regained consciousness the water hadn't begun to boil yet. Denies incontinence, tongue biting, and palpitations." EEG result pending at the time of discharge.  He again had admission on 12/03/15 at [facility] hospital for exactly same story. As per note: "65 y.o. male with a past medical history of coronary artery disease and sick-sinus syndrome s/p pacemaker who was admitted to [facility] hospital for left sided weakness and left sided visual field deficit. Patient represents with recurrence of left sided weakness and left visual field cut. He was previously treated on 11/17/15 with tPA for similar symptoms with complete resolution at the time of discharge from rehab. This episode was precipitated by a syncopal episode and chest pain. Cardiology evaluated his pacemaker without any evidence of arrhythmia or pacemaker dysfunction. His presentation is inconsistent with an acute stroke. In addition, on exam, he has functional neurological symptoms as demonstrated by positive Hoover's sign, reverse Hoover's sign, and was able to grab his thumb without visual cues despite having absent joint position sense. He has also had multiple admissions over the past 1-2 years with similar neurologic symptoms at multiple institutions and has received tPA at least twice. There was no clear secondary gain. His exam greatly improved during the hospital course with return of his strength to 5/5 in all extremities and resolution of his visual field deficits. Interestingly, physical therapy also noted inconsistent weakness while working with him. Multiple  discussions were held with the patient to attempt to provide the patient with an opportunity to discuss social or psychological factors that may be causing these symptoms. Ultimately, the patient denied any psychosocial difficulties. He was discharged without any changes to his medication and without any indication for physical/occupational therapies."    HOSPITAL COURSE Jacob Pineda is a 65 y.o. male with history of hypertension, hyperlipidemia, coronary artery disease, bradycardia with pacemaker placement, and previous stroke presenting with recurrent syncopal spells, loss of vision on the left side and left face arm and leg numnbess. He received IV t-PA 12/13/3015  at 2125 for presumed stroke diagnosis.   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 Exam consistent with functional component.   Pt had admissions 4/26 in [place], [place] and 5/12 at [facility] for exact same presentation, received tPA on 4/26, but found both times having functional presentation. Noted that pt had similar admission past 1-2 years in multiple institution for exact same symptoms, all resolved fully.   MRI / MRA pacemaker  CTA head mild narrowing L M1, mod stenosis distal L P2  CTA neck no significant stenosis  CTA chest/pevlis/ abd no acute findings. Diverticulosis of colon. Likely renal cysts, mild ectasia of ascending thoracic aorta  Repeat CT head negative  2D Echo EF 60-65%, No source of embolus  EEG normal  LDL 87  HgbA1c 5.8  aspirin 81 mg daily prior to admission, continued on ASA 81mg   Ongoing aggressive stroke risk factor management  pscyh consult - can make own decisions, no safety concerns, no psychological stressors, does not meet criteria for antidepressant tx.   Therapy recommendations: SNF  Disposition:  SNF  Hyperlipidemia  Home meds: lipitor 80  LDL 87, goal < 70  Continue statin at discharge  Other Stroke Risk Factors  Advanced age  Former Cigarette smoker  Coronary artery disease w/ stent 2012  Other Active Problems  Diastolic dysfunction  Symptomatic bradycardia s/p Pacemaker 2012  Pt denies recent stress   DISCHARGE EXAM Blood pressure 117/66, pulse 62, temperature 98.4 F (36.9 C), temperature source Oral, resp. rate 18, height 5\' 11"  (1.803 m), weight 84.913 kg (187 lb 3.2 oz), SpO2 99 %. General - Well nourished, well developed, in no apparent distress.  Ophthalmologic - Sharp disc margins OU.   Cardiovascular - Regular rate and rhythm with no murmur.  Mental Status -  Level of arousal and orientation to time, place, and person were intact. Language including expression, naming, repetition, comprehension was assessed and found intact. Fund of Knowledge was assessed and was intact.  Cranial Nerves II - XII - II - left hemianopia. However, able to track object at left visual side without difficulty.  III, IV, VI - Extraocular movements intact. V - Facial sensation intact bilaterally. VII - Facial movement intact bilaterally. VIII - Hearing & vestibular intact bilaterally. X - Palate elevates symmetrically. XI - Chin turning & shoulder shrug intact bilaterally. XII - Tongue protrusion intact.  Motor Strength - The patient's strength was 4+/5 LUE with drift, however no pronation, and also drift would stop with distraction, 4/5 LLE with positive hoover's sign. Bulk was normal and fasciculations were absent.  Motor Tone - Muscle tone was assessed at the neck and appendages and was normal.  Reflexes - The patient's reflexes were 1+ in all extremities and he had no pathological reflexes.  Sensory - Light touch, temperature/pinprick were assessed and were decreased on the left, with 30-40% comparing with right.   Coordination - The patient had normal  movements in the hands with no ataxia or dysmetria. Tremor was absent.  Gait and Station - not tested due to safety concerns post tPA.  Discharge Diet   Diet Heart Room service appropriate?: Yes; Fluid consistency:: Thin liquids  DISCHARGE PLAN  Disposition:  Discharge to skilled nursing facility for ongoing PT, OT and ST.    aspirin 81 mg daily for secondary stroke prevention.  Follow-up MD at SNF  No neurologic follow up indicated  45 minutes were spent preparing discharge.  Marvel Plan, MD PhD Stroke Neurology 12/17/2015 9:31 PM

## 2015-12-17 NOTE — Clinical Social Work Note (Signed)
[facility] does not have a bed available anymore, CSW informed patient he is agreeable to going to [facility] for [facility] rehab.  CS[facility] contacted [facility] who said they can take patient today, CSW updated physician and bedside nurse. Patient to be d/c'ed today to [facility].  Patient and family agreeable to plans will transport via ems RN to call report to [facility] [phone].  Ervin KnackEric R. Yaxiel Minnie, MSW, Theresia MajorsLCSWA [phone] 12/17/2015 4:10 PM

## 2015-12-17 NOTE — Consult Note (Signed)
[facility] Face-to-Face Psychiatry Consult   Reason for Consult:  Depression and anxiety Referring Physician:  Dr. Roda Shutters Patient Identification: Jacob Pineda MRN:  [id number] Principal Diagnosis: Adjustment disorder with mixed anxiety and depressed mood Diagnosis:   Patient Active Problem List   Diagnosis Date Noted  . Adjustment disorder with mixed anxiety and depressed mood [F43.23] 12/17/2015  . CVA (cerebral infarction) [I63.9] 12/14/2015  . Faintness [R55]   . Right-sided chest wall pain [R07.89] 12/26/2014  . 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 Dysarthria [R47.1] 12/26/2014  . Stroke [facility]) [I63.9]   . Diastolic dysfunction [I51.9]   . Pacemaker [Z95.0]   . Pain in the chest [R07.9]   . Headache [R51]   . Chronic diastolic heart failure (HCC) [I50.32]   . Dizziness [R42] 07/26/2013  . Syncope [R55] 07/25/2013  . History of bradycardia [Z86.79] 07/25/2013  . CAD (coronary artery disease) [I25.10] 07/25/2013  . Unstable angina (HCC) [I20.0] 07/24/2013    Total Time spent with patient: 1 hour  Subjective:   Jacob Pineda is a 65 y.o. male patient admitted with recurrent syncopal episodes and questionable depression. Anxiety and conversion.  HPI:  Jacob Pineda is an 65 y.o. male, Seen, chart reviewed for the face-to-face psychiatric consultation and evaluation and case discussed with the Dr. Roda Shutters, neurologist. Patient stated he came to the hospital second time to be admitted because of mild stroke. Patient reportedly working in [employer] and lives by himself now apartment. Patient reportedly has several friends in [place], work-related friends but no family members. Patient does not believe stop working until he can not physically work. Reportedly patient wife died 7 years ago and has no children. Patient used to have a dog which was killed in a car accident longtime ago. Patient denies current psychosocial stressors,  worries and denies symptoms of depression, anxiety, psychosis, suicidal/homicidal ideation, intention or plans. Patient is hoping to be discharged today to a brief rehabilitation therapy before he woke back to  his work. Patient is calm and cooperative and pleasant during my evaluation. Patient has extended family members who lives in [place].  Medical history: Patient with history of hypertension, hyperlipidemia, coronary artery disease, bradycardia with pacemaker placement, and previous stroke, presenting with recurrent syncopal spells as well as loss of vision on the left side and numbness involving left face arm and leg. Code stroke was activated at [facility] ED. Patient was subsequently transferred to [facility] for further management. He reportedly experienced 2 episodes of syncope at [facility] ED. CT scan of his head showed no acute intracranial abnormality. He complained of chest pain and also underwent CT angiogram of his chest which was unremarkable. NIH stroke score was 5. Patient was deemed a candidate for intravenous thrombolytic therapy with TPA, which was administered. Patient was subsequently admitted to neuro intensive care unit for further management.  Past Psychiatric History: none reported  Risk to Self: Is patient at risk for suicide?: No Risk to Others:   Prior Inpatient Therapy:   Prior Outpatient Therapy:    Past Medical History:  Past Medical History  Diagnosis Date  . Coronary artery disease   . Pacemaker   . Diastolic dysfunction   . Stroke [facility])     Past Surgical History  Procedure Laterality Date  . Pacemaker insertion    . Cardiac stents times 2    . Cholecystectomy    . Left heart catheterization with coronary angiogram N/A 07/25/2013  Procedure: LEFT HEART CATHETERIZATION WITH CORONARY ANGIOGRAM;  Surgeon: Laurey Moralealton S McLean, MD;  [facility] LAB;  Service: Cardiovascular;  Laterality: N/A;   Family History: No family history on file. Family  Psychiatric  History: None reported Social History:  History  Alcohol Use No     History  Drug Use No    Social History   Social History  . Marital Status: Widowed    Spouse Name: N/A  . Number of Children: N/A  . Years of Education: N/A   Social History Main Topics  . Smoking status: Former Games developermoker  . Smokeless tobacco: None  . Alcohol Use: No  . Drug Use: No  . Sexual Activity: Yes   Other Topics Concern  . None   Social History Narrative   Additional Social History:    Allergies:  No Known Allergies  Labs: No results found for this or any previous visit (from the past 48 hour(s)).  Current Facility-Administered Medications  Medication Dose Route Frequency Provider Last Rate Last Dose  . acetaminophen (TYLENOL) tablet 650 mg  650 mg Oral Q4H PRN Noel Christmasharles Stewart      . aspirin chewable tablet 81 mg  81 mg Oral Daily Marvel PlanJindong Xu, MD   81 mg at 12/16/15 [id number]  . atorvastatin (LIPITOR) tablet 80 mg  80 mg Oral q1800 Marvel PlanJindong Xu, MD   80 mg at 12/16/15 1737  . labetalol (NORMODYNE,TRANDATE) injection 10 mg  10 mg Intravenous Q2H PRN Marvel PlanJindong Xu, MD      . multivitamin with minerals tablet 1 tablet  1 tablet Oral Daily Marvel PlanJindong Xu, MD   1 tablet at 12/16/15 [id number]  . nitroGLYCERIN (NITROSTAT) SL tablet 0.4 mg  0.4 mg Sublingual Q5 min PRN Samantha Tripp Dowless, PA-C      . ondansetron (ZOFRAN) injection 4 mg  4 mg Intravenous Q4H PRN Rejeana BrockMcNeill P Kirkpatrick, MD   4 mg at 12/17/15 [id number]    Musculoskeletal: Strength & Muscle Tone: decreased Gait & Station: unable to stand Patient leans: Right  Psychiatric Specialty Exam: Physical Exam as per history and physical   ROS left leg weakness No Fever-chills, No Headache, No changes with Vision or hearing, reports vertigo No problems swallowing food or Liquids, No Chest pain, Cough or Shortness of Breath, No Abdominal pain, No Nausea or Vommitting, Bowel movements are regular, No Blood in stool or Urine, No dysuria, No new skin  rashes or bruises, No new joints pains-aches,  No new weakness, tingling, numbness in any extremity, No recent weight gain or loss, No polyuria, polydypsia or polyphagia,   A full 10 point Review of Systems was done, except as stated above, all other Review of Systems were negative.  Blood pressure 112/69, pulse 64, temperature 98.4 F (36.9 C), temperature source Oral, resp. rate 18, height 5\' 11"  (1.803 m), weight 84.913 kg (187 lb 3.2 oz), SpO2 98 %.Body mass index is 26.12 kg/(m^2).  General Appearance: Casual  Eye Contact:  Good  Speech:  Clear and Coherent  Volume:  Decreased  Mood:  Euthymic  Affect:  Appropriate and Congruent  Thought Process:  Coherent and Goal Directed  Orientation:  Full (Time, Place, and Person)  Thought Content:  WDL and Logical  Suicidal Thoughts:  No  Homicidal Thoughts:  No  Memory:  Immediate;   Good Recent;   Fair  Judgement:  Intact  Insight:  Fair  Psychomotor Activity:  Decreased  Concentration:  Concentration: Good  Recall:  Good  Fund of Knowledge:  Good  Language:  Good  Akathisia:  Negative  Handed:  Right  AIMS (if indicated):     Assets:  Communication Skills Desire for Improvement Financial Resources/Insurance Housing Leisure Time Resilience Social Support Talents/Skills Transportation Vocational/Educational  ADL's:  Impaired  Cognition:  WNL  Sleep:        Treatment Plan Summary: Patient meets criteria for capacity to make his own medical decisions and living arrangements based on my evaluation. Patient has no safety concerns. Patient has no known psychosocial stressors Patient does not meet criteria for antidepressant medication treatment.   Disposition:  Patient will be referred to the outpatient medication treatment when medically stable and required. Patient does not meet criteria for psychiatric inpatient admission. Supportive therapy provided about ongoing stressors.  Leata Mouse, MD 12/17/2015  9:25 AM

## 2015-12-17 NOTE — Progress Notes (Signed)
Pt discharged to facility, waiting on [facility] to arrive to transport pt. Assessment stable, discharge instructions reviewed with pt, all questions answered. IV removed. Telemetry removed. Pt given copy of discharge instructions.    Report also given to nurse at facility.

## 2015-12-17 NOTE — Clinical Social Work Note (Signed)
Clinical Social Work Assessment  Patient Details  Name: Jacob Pineda MRN: [id number] Date of Birth: 1951-06-06  Date of referral:  12/16/15               Reason for consult:  Facility Placement                Permission sought to share information with:  Facility Industrial/product designerContact Representative Permission granted to share information::  Yes, Verbal Permission Granted  Name::        Agency::  SNF admissions  Relationship::     Contact Information:     Housing/Transportation Living arrangements for the past 2 months:  Single Family Home Source of Information:  Patient Patient Interpreter Needed:  None Criminal Activity/Legal Involvement Pertinent to Current Situation/Hospitalization:  No - Comment as needed Significant Relationships:  None Lives with:  Self Do you feel safe going back to the place where you live?  No (Patient feels he needs some short term rehab before he is able to go home.) Need for family participation in patient care:     Care giving concerns:  Patient feels he needs some short term rehab before he can return back home    Social Worker assessment / plan:  Patient is a 65 year old male who lives alone, patient is alert and oriented x4 and able to express his opinions.  Patient states he has been to rehab before, CSW reminded him of the process for discharge planning and role of CSW.  Patient stated [place] in [place] in [place] and just left in April.  Patient stated he was famialair with the rehab process, and is hopeful to not have to be in SNF very long.  Patient is appreciative of care that is being provided, and is lookin forward to getting home as soon as he can.  Employment status:  Retired Health and safety inspectornsurance information:  Medicare PT Recommendations:  Skilled Nursing Facility Information / Referral to community resources:     Patient/Family's Response to care:  Patient in agreement to going to SNF for rehab.  Patient/Family's Understanding of and Emotional  Response to Diagnosis, Current Treatment, and Prognosis:  Patient aware of current treatment and prognosis.  Emotional Assessment Appearance:  Appears stated age Attitude/Demeanor/Rapport:    Affect (typically observed):  Appropriate, Calm Orientation:  Oriented to Self, Oriented to Place, Oriented to  Time, Oriented to Situation Alcohol / Substance use:  Not Applicable Psych involvement (Current and /or in the community):  No (Comment)  Discharge Needs  Concerns to be addressed:  Lack of Support Readmission within the last 30 days:    Current discharge risk:  Lack of support system, Lives alone Barriers to Discharge:  No Barriers Identified   [place] [employer], [name], LCSWA 12/17/2015, 3:47 PM

## 2015-12-17 NOTE — Progress Notes (Signed)
Report given to Henrene PastorJanet, Rn at facility.

## 2015-12-17 NOTE — Progress Notes (Signed)
Pt leaving with [facility] 

## 2015-12-17 NOTE — Clinical Social Work Placement (Signed)
   CLINICAL SOCIAL WORK PLACEMENT  NOTE  Date:  12/17/2015  Patient Details  Name: Jacob Pineda MRN: [id number] Date of Birth: 11-07-1950  Clinical Social Work is seeking post-discharge placement for this patient at the Skilled  Nursing Facility level of care (*CSW will initial, date and re-position this form in  chart as items are completed):  Yes   Patient/family provided with [facility] Clinical Social Work Department's list of facilities offering this level of care within the geographic area requested by the patient (or if unable, by the patient's family).  Yes   Patient/family informed of their freedom to choose among providers that offer the needed level of care, that participate in Medicare, Medicaid or managed care program needed by the patient, have an available bed and are willing to accept the patient.  Yes   Patient/family informed of [facility]'s ownership interest in [facility] and [facility], as well as of the fact that they are under no obligation to receive care at these facilities.  PASRR submitted to EDS on 12/17/15     PASRR number received on       Existing PASRR number confirmed on 12/15/15     FL2 transmitted to all facilities in geographic area requested by pt/family on 12/16/15     FL2 transmitted to all facilities within larger geographic area on       Patient informed that his/her managed care company has contracts with or will negotiate with certain facilities, including the following:        Yes   Patient/family informed of bed offers received.  [facility]     Physician recommends and patient chooses bed at      [facility] to [facility] on 12/17/15.  Patient to be transferred to facility by [facility]     Patient family notified on 12/17/15 of transfer.  Name of family member notified:  Patient does not have any family locally      PHYSICIAN       Additional Comment:    _______________________________________________ Darleene CleaverAnterhaus, Saidah Kempton R, LCSWA 12/17/2015, 4:00 PM

## 2015-12-17 NOTE — Progress Notes (Signed)
rn assisted pt from bed to chair. Requesting zofran medication

## 2015-12-17 NOTE — Progress Notes (Signed)
Physical Therapy Treatment Patient Details Name: Jacob Pineda MRN: [id number] DOB: January 28, 1951 Today's Date: 12/17/2015    History of Present Illness 65 y.o. male history of hypertension, hyperlipidemia, coronary artery disease, bradycardia with pacemaker placement, diastolic dysfunction, and previous stroke, who presented with recurrent syncopal spells as well as loss of vision on the left side and numbness involving left face arm and leg. Code stroke [facility]. [facility] was [facility]. He reportedly experienced 2 episodes of [facility]. CT scan of his head showed no acute intracranial abnormality. He complained of chest pain and also underwent CT angiogram of his chest which was unremarkable. Patient was deemed a candidate for intravenous thrombolytic therapy with TPA, which was administered.    PT Comments    Patient tolerating slightly longer bouts of ambulation prior to seated rests this session, but still relies on chair close due to (today) L LE weakness, (yesterday due to lightheadedness).  Feel continued skilled PT in SNF setting appropriate for maximal mobility, independence and safety prior to d/c home.  Follow Up Recommendations  SNF     Equipment Recommendations  Rolling walker with 5" wheels    Recommendations for Other Services       Precautions / Restrictions Precautions Precautions: Fall    Mobility  Bed Mobility               General bed mobility comments: up in chair  Transfers   Equipment used: Rolling walker (2 wheeled) Transfers: Sit to/from Stand Sit to Stand: Supervision         General transfer comment: stands pushing up from  chair appropriately, cues to reach for chair prior to sitting and occasional minguard due to L LE weakness  Ambulation/Gait   Ambulation Distance (Feet): 90 Feet Assistive device: Rolling walker (2 wheeled) Gait  Pattern/deviations: Step-through pattern;Decreased stride length     General Gait Details: 4 seated rest breaks due to L LE weakness; orthostatics taken prior to ambulation with no sign of orthostasis.  Patient demonstrating at times longer step with R increasing L knee buckling; seems to be able to walk without knee buckling when taking shorter steps   Stairs            Wheelchair Mobility    Modified Rankin (Stroke Patients Only)       Balance Overall balance assessment: Needs assistance   Sitting balance-Leahy Scale: Good     Standing balance support: Bilateral upper extremity supported Standing balance-Leahy Scale: Poor Standing balance comment: UE A standing for orthostatics with min A due to at times L LE buckling; assist to keep weight shifted R; stood for over 3 minutes for orthostatic vitals                    Cognition Arousal/Alertness: Awake/alert Behavior During Therapy: WFL for tasks assessed/performed Overall Cognitive Status: Within Functional Limits for tasks assessed                      Exercises      General Comments        Pertinent Vitals/Pain Pain Assessment: No/denies pain  Orthostatic VS for the past 24 hrs:  BP- Sitting Pulse- Sitting BP- Standing at 0 minutes Pulse- Standing at 0 minutes  12/17/15 1022 117/62 mmHg 62 122/69 mmHg 66  Standing at 3 minutes:  119/68; HR 61      Home Living  Prior Function            PT Goals (current goals can now be found in the care plan section) Progress towards PT goals: Progressing toward goals    Frequency  Min 3X/week    PT Plan Discharge plan needs to be updated;Frequency needs to be updated    Co-evaluation             End of Session Equipment Utilized During Treatment: Gait belt Activity Tolerance: Patient tolerated treatment well Patient left: in chair;with call bell/phone within reach;with chair alarm set     Time:  [phone] PT Time Calculation (min) (ACUTE ONLY): 25 min  Charges:  $Gait Training: 8-22 mins $Therapeutic Activity: 8-22 mins                    G Codes:      Elray Mcgregor 01/10/16, 10:21 AM  Sheran Lawless, PT [phone] Jan 10, 2016

## 2015-12-21 ENCOUNTER — Encounter: Payer: Self-pay | Admitting: Internal Medicine

## 2015-12-21 ENCOUNTER — Non-Acute Institutional Stay (SKILLED_NURSING_FACILITY): Payer: Medicare Other | Admitting: Internal Medicine

## 2015-12-21 DIAGNOSIS — E785 Hyperlipidemia, unspecified: Secondary | ICD-10-CM | POA: Diagnosis not present

## 2015-12-21 DIAGNOSIS — K219 Gastro-esophageal reflux disease without esophagitis: Secondary | ICD-10-CM

## 2015-12-21 DIAGNOSIS — Z950 Presence of cardiac pacemaker: Secondary | ICD-10-CM

## 2015-12-21 DIAGNOSIS — I5032 Chronic diastolic (congestive) heart failure: Secondary | ICD-10-CM | POA: Diagnosis not present

## 2015-12-21 DIAGNOSIS — R2990 Unspecified symptoms and signs involving the nervous system: Secondary | ICD-10-CM

## 2015-12-21 DIAGNOSIS — F4323 Adjustment disorder with mixed anxiety and depressed mood: Secondary | ICD-10-CM

## 2015-12-21 DIAGNOSIS — I10 Essential (primary) hypertension: Secondary | ICD-10-CM

## 2015-12-21 DIAGNOSIS — R7303 Prediabetes: Secondary | ICD-10-CM | POA: Diagnosis not present

## 2015-12-21 DIAGNOSIS — I639 Cerebral infarction, unspecified: Secondary | ICD-10-CM

## 2015-12-21 NOTE — Progress Notes (Signed)
  HISTORY AND PHYSICAL   DATE: 12/21/15  Location:  [facility]  Nursing Home Room Number: 109 A Place of Service: SNF (31)   Extended Emergency Contact Information Primary Emergency Contact: Chatterjee,Bobby  [place] Home Phone: [phone] Relation: Brother  Advanced Directive information Does patient have an advance directive?: No, Would patient like information on creating an advanced directive?: No - patient declined information FULL CODE Chief Complaint  Patient presents with  . 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 He presented to the ED with strke-like sx's and tx with tPA. CT head/neck showed no acute process. His NIHSS was 5-6. 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 He was seen by psych services. LDL 87. A1c 5.8%. He was d/cd to SNF for short term rehab. Note he has had multiple admissions this year for similar sx's and has rec'd tPA in last 3 mos prior at another hospital system per hospital records.  Pt c/o nausea today and states that he was previously taking a PPI for GERD. No other concerns. Left weakness much improved. No falls. Sleeping ok and appetite ok. No nursing issues. He is a poor historian due to psych d/o. Hx obtained from chart.  CAD/HF/HTN - he had stents in the past. He takes ASA and statin. No c/o of CP  Hyperlipidemia - stable on lipitor. LDL 87  Hx bradycardia - s/p pacer. Rate controlled  Hx CVA - takes ASA daily   Past Medical History  Diagnosis Date  . Coronary artery disease   . Pacemaker   . Diastolic dysfunction   . Stroke (HCC)     Past Surgical History  Procedure Laterality Date  . Pacemaker  insertion    . Cardiac stents times 2    . Cholecystectomy    . Left heart catheterization with coronary angiogram N/A 07/25/2013    Procedure: LEFT HEART CATHETERIZATION WITH CORONARY ANGIOGRAM;  Surgeon: Dalton S McLean, MD;  Location: [facility] CATH LAB;  Service: Cardiovascular;  Laterality: N/A;    Patient Care Team: Provider Default, MD as PCP - General  Social History   Social History  . Marital Status: Widowed    Spouse Name: N/A  . Number of Children: N/A  . Years of Education: N/A   Occupational History  . Not on file.   Social History Main Topics  . Smoking status: Former Smoker  . Smokeless tobacco: Not on file  . Alcohol Use: No  . Drug Use: No  . Sexual Activity: Yes   Other Topics Concern  . Not on file   Social History Narrative     reports that he has quit smoking. He does not have any smokeless tobacco history on file. He reports that he does not drink alcohol or use illicit drugs.  FHx (+) mother (deceased) with breast cancer      There is no immunization history on file for this patient.  No Known Allergies  Medications: Patient's Medications  New Prescriptions   No medications on file  Previous Medications   ACETAMINOPHEN (TYLENOL) 500 MG TABLET    Take 1,000 mg by mouth daily as needed for mild pain.    ASPIRIN EC 81 MG TABLET      Take 81 mg by mouth daily.   ATORVASTATIN (LIPITOR) 80 MG TABLET    Take 1 tablet (80 mg total) by mouth daily at 6 PM.   MULTIPLE VITAMIN (MULTIVITAMIN WITH MINERALS) TABS    Take 1 tablet by mouth daily.  Modified Medications   No medications on file  Discontinued Medications   No medications on file    Review of Systems  Unable to perform ROS: Psychiatric disorder    Filed Vitals:   12/21/15 0937  BP: 120/80  Pulse: 80  Temp: 98 F (36.7 C)  TempSrc: Oral  Resp: 18  Height: 5' 11" (1.803 m)  Weight: 228 lb 3.2 oz (103.511 kg)   Body mass index is 31.84 kg/(m^2).  Physical Exam  Constitutional: He  is oriented to person, place, and time. He appears well-developed and well-nourished.  Sitting in w/c in NAD  HENT:  Mouth/Throat: Oropharynx is clear and moist.  Eyes: Pupils are equal, round, and reactive to light. No scleral icterus.  Neck: Neck supple. Carotid bruit is not present. No thyromegaly present.  Cardiovascular: Regular rhythm, normal heart sounds and intact distal pulses.  Bradycardia present.  Exam reveals no gallop and no friction rub.   No murmur heard. no distal LE swelling. No calf TTP  Pulmonary/Chest: Effort normal and breath sounds normal. He has no wheezes. He has no rales. He exhibits no tenderness.  (+) palpable ACW pacer  Abdominal: Soft. Bowel sounds are normal. He exhibits no distension, no abdominal bruit, no pulsatile midline mass and no mass. There is no tenderness. There is no rebound and no guarding.  Musculoskeletal: He exhibits edema.  Lymphadenopathy:    He has no cervical adenopathy.  Neurological: He is alert and oriented to person, place, and time. He has normal strength and normal reflexes. He displays no tremor. No cranial nerve deficit (CN 2-12 grossly intact).  Skin: Skin is warm and dry. No rash noted.  Psychiatric: He has a normal mood and affect. His behavior is normal.     Labs reviewed: Admission on 12/13/2015, Discharged on 12/17/2015  Component Date Value Ref Range Status  . Sodium 12/13/2015 136  135 - 145 mmol/L Final  . Potassium 12/13/2015 4.7  3.5 - 5.1 mmol/L Final  . Chloride 12/13/2015 107  101 - 111 mmol/L Final  . CO2 12/13/2015 22  22 - 32 mmol/L Final  . Glucose, Bld 12/13/2015 100* 65 - 99 mg/dL Final  . BUN 12/13/2015 12  6 - 20 mg/dL Final  . Creatinine, Ser 12/13/2015 1.11  0.61 - 1.24 mg/dL Final  . Calcium 12/13/2015 9.2  8.9 - 10.3 mg/dL Final  . GFR calc non Af Amer 12/13/2015 >60  >60 mL/min Final  . GFR calc Af Amer 12/13/2015 >60  >60 mL/min Final   Comment: (NOTE) The eGFR has been calculated using the CKD  EPI equation. This calculation has not been validated in all clinical situations. eGFR's persistently <60 mL/min signify possible Chronic Kidney Disease.   . Anion gap 12/13/2015 7  5 - 15 Final  . WBC 12/13/2015 7.4  4.0 - 10.5 K/uL Final  . RBC 12/13/2015 4.44  4.22 - 5.81 MIL/uL Final  . Hemoglobin 12/13/2015 13.3  13.0 - 17.0 g/dL Final  . HCT 12/13/2015 40.8  39.0 - 52.0 % Final  . MCV 12/13/2015 91.9  78.0 - 100.0 fL Final  . MCH 12/13/2015 30.0  26.0 - 34.0 pg Final  . MCHC 12/13/2015 32.6  30.0 - 36.0 g/dL  Final  . RDW 12/13/2015 14.3  11.5 - 15.5 % Final  . Platelets 12/13/2015 202  150 - 400 K/uL Final  . Color, Urine 12/13/2015 YELLOW  YELLOW Final  . APPearance 12/13/2015 CLEAR  CLEAR Final  . Specific Gravity, Urine 12/13/2015 1.020  1.005 - 1.030 Final  . pH 12/13/2015 6.5  5.0 - 8.0 Final  . Glucose, UA 12/13/2015 NEGATIVE  NEGATIVE mg/dL Final  . Hgb urine dipstick 12/13/2015 TRACE* NEGATIVE Final  . Bilirubin Urine 12/13/2015 NEGATIVE  NEGATIVE Final  . Ketones, ur 12/13/2015 NEGATIVE  NEGATIVE mg/dL Final  . Protein, ur 12/13/2015 NEGATIVE  NEGATIVE mg/dL Final  . Nitrite 12/13/2015 NEGATIVE  NEGATIVE Final  . Leukocytes, UA 12/13/2015 NEGATIVE  NEGATIVE Final  . Glucose-Capillary 12/13/2015 98  65 - 99 mg/dL Final  . Sodium 12/13/2015 142  135 - 145 mmol/L Final  . Potassium 12/13/2015 3.9  3.5 - 5.1 mmol/L Final  . Chloride 12/13/2015 104  101 - 111 mmol/L Final  . BUN 12/13/2015 13  6 - 20 mg/dL Final  . Creatinine, Ser 12/13/2015 1.00  0.61 - 1.24 mg/dL Final  . Glucose, Bld 12/13/2015 98  65 - 99 mg/dL Final  . Calcium, Ion 12/13/2015 1.18  1.13 - 1.30 mmol/L Final  . TCO2 12/13/2015 25  0 - 100 mmol/L Final  . Hemoglobin 12/13/2015 14.6  13.0 - 17.0 g/dL Final  . HCT 12/13/2015 43.0  39.0 - 52.0 % Final  . Alcohol, Ethyl (B) 12/13/2015 <5  <5 mg/dL Corrected   Comment:        LOWEST DETECTABLE LIMIT FOR SERUM ALCOHOL IS 5 mg/dL FOR MEDICAL PURPOSES  ONLY CORRECTED ON 05/22 AT 2344: PREVIOUSLY REPORTED AS <5   . Neutrophils Relative % 12/13/2015 62   Final  . Neutro Abs 12/13/2015 4.5  1.7 - 7.7 K/uL Final  . Lymphocytes Relative 12/13/2015 24   Final  . Lymphs Abs 12/13/2015 1.8  0.7 - 4.0 K/uL Final  . Monocytes Relative 12/13/2015 13   Final  . Monocytes Absolute 12/13/2015 0.9  0.1 - 1.0 K/uL Final  . Eosinophils Relative 12/13/2015 1   Final  . Eosinophils Absolute 12/13/2015 0.1  0.0 - 0.7 K/uL Final  . Basophils Relative 12/13/2015 0   Final  . Basophils Absolute 12/13/2015 0.0  0.0 - 0.1 K/uL Final  . Opiates 12/13/2015 NONE DETECTED  NONE DETECTED Final  . Cocaine 12/13/2015 NONE DETECTED  NONE DETECTED Final  . Benzodiazepines 12/13/2015 NONE DETECTED  NONE DETECTED Final  . Amphetamines 12/13/2015 NONE DETECTED  NONE DETECTED Final  . Tetrahydrocannabinol 12/13/2015 NONE DETECTED  NONE DETECTED Final  . Barbiturates 12/13/2015 NONE DETECTED  NONE DETECTED Final   Comment:        DRUG SCREEN FOR MEDICAL PURPOSES ONLY.  IF CONFIRMATION IS NEEDED FOR ANY PURPOSE, NOTIFY LAB WITHIN 5 DAYS.        LOWEST DETECTABLE LIMITS FOR URINE DRUG SCREEN Drug Class       Cutoff (ng/mL) Amphetamine      1000 Barbiturate      200 Benzodiazepine   200 Tricyclics       300 Opiates          300 Cocaine          300 THC              50   . Squamous Epithelial / LPF 12/13/2015 0-5* NONE SEEN Final  . WBC, UA 12/13/2015 0-5  0 - 5   WBC/hpf Final  . RBC / HPF 12/13/2015 0-5  0 - 5 RBC/hpf Final  . Bacteria, UA 12/13/2015 NONE SEEN  NONE SEEN Final  . Casts 12/13/2015 HYALINE CASTS* NEGATIVE Final  . Hgb A1c MFr Bld 12/14/2015 5.8* 4.8 - 5.6 % Final   Comment: (NOTE)         Pre-diabetes: 5.7 - 6.4         Diabetes: >6.4         Glycemic control for adults with diabetes: <7.0   . Mean Plasma Glucose 12/14/2015 120   Final   Comment: (NOTE) Performed At: [facility] [place], [place] [id number] Lindon Romp MD LG:[id number]   . Cholesterol 12/14/2015 140  0 - 200 mg/dL Final  . Triglycerides 12/14/2015 52  <150 mg/dL Final  . HDL 12/14/2015 43  >40 mg/dL Final  . Total CHOL/HDL Ratio 12/14/2015 3.3   Final  . VLDL 12/14/2015 10  0 - 40 mg/dL Final  . LDL Cholesterol 12/14/2015 87  0 - 99 mg/dL Final   Comment:        Total Cholesterol/HDL:CHD Risk Coronary Heart Disease Risk Table                     Men   Women  1/2 Average Risk   3.4   3.3  Average Risk       5.0   4.4  2 X Average Risk   9.6   7.1  3 X Average Risk  23.4   11.0        Use the calculated Patient Ratio above and the CHD Risk Table to determine the patient's CHD Risk.        ATP III CLASSIFICATION (LDL):  <100     mg/dL   Optimal  100-129  mg/dL   Near or Above                    Optimal  130-159  mg/dL   Borderline  160-189  mg/dL   High  >190     mg/dL   Very High   . Weight 12/14/2015 2881.85   Final  . Height 12/14/2015 71   Final  . BP 12/14/2015 132/87   Final  . MRSA by PCR 12/14/2015 NEGATIVE  NEGATIVE Final   Comment:        The GeneXpert MRSA Assay (FDA approved for NASAL specimens only), is one component of a comprehensive MRSA colonization surveillance program. It is not intended to diagnose MRSA infection nor to guide or monitor treatment for MRSA infections.     Ct Angio Head W/cm &/or Wo Cm  12/14/2015  CLINICAL DATA:  Initial valuation for acute syncope, left-sided weakness. EXAM: CT ANGIOGRAPHY HEAD AND NECK TECHNIQUE: Multidetector CT imaging of the head and neck was performed using the standard protocol during bolus administration of intravenous contrast. Multiplanar CT image reconstructions and MIPs were obtained to evaluate the vascular anatomy. Carotid stenosis measurements (when applicable) are obtained utilizing NASCET criteria, using the distal internal carotid diameter as the denominator. CONTRAST: 50 cc of Isovue 370. COMPARISON:  Prior head CT from earlier the same  day. FINDINGS: CTA NECK Aortic arch: Visualized aortic arch is of normal caliber with normal branch pattern. Minimal plaque within the arch itself. No high-grade stenosis at the origin of the great vessels. Visualized subclavian arteries are widely patent. Right carotid system: Right common carotid artery widely patent from its origin  to the bifurcation. Minimal noncalcified plaque about the right bifurcation without significant stenosis. Right ICA widely patent from the bifurcation to the skullbase. No flow limiting stenosis, dissection, or vascular occlusion within the right carotid artery system. Right external carotid artery and its branches within normal limits. Left carotid system: Left common carotid artery widely patent from its origin to the bifurcation. No significant plaque about the left bifurcation. Left ICA widely patent from the bifurcation to the skullbase. No dissection, stenosis, or vascular occlusion within the left carotid artery system. Left external carotid artery and its branches within normal limits. Vertebral arteries:Both vertebral arteries arise from the subclavian arteries. Left vertebral artery is dominant. Vertebral arteries are widely patent without stenosis, dissection, or occlusion. Skeleton: No acute osseous abnormality. No worrisome lytic or blastic osseous lesions. Mild degenerative spondylolysis at C5-6 and C6-7. Other neck: Mild atelectatic changes seen dependently within the visualized lungs. Visualized lungs are otherwise clear. Visualized superior mediastinum within normal limits. Thyroid gland normal. No adenopathy within the neck. No acute soft tissue abnormality. CTA HEAD Anterior circulation: The petrous segments widely patent bilaterally. Scattered calcified atheromatous plaque within the cavernous/ supraclinoid ICAs with mild diffuse narrowing (less than 25%). A1 segments widely patent. Anterior communicating artery normal. Anterior cerebral arteries well opacified to  their distal aspects. M1 segments patent without occlusion. Mild smooth narrowing of the distal left M1 segment (series 705, image 16). Right M1 segment widely patent. MCA bifurcations normal. No proximal M2 branch stenosis or occlusion. Distal MCA branches well opacified and symmetric. Posterior circulation: Dominant left vertebral artery widely patent to the vertebrobasilar junction. Minimal plaque within the left V4 segment without stenosis. Hypoplastic right vertebral artery largely terminates and high Ki, although a small hypoplastic branch ascends towards the vertebrobasilar junction. Posterior inferior cerebral arteries themselves are patent. Basilar artery widely patent. Superior cerebellar arteries well opacified bilaterally. Both of the posterior cerebral arteries arise from the basilar artery and are well opacified to their distal aspects. Moderate short-segment stenosis of the right P2 segment just prior to its bifurcation (series 706, image 15). Mild atheromatous irregularity within the right P2 segment proximally. Venous sinuses: Patent without evidence for venous sinus thrombosis. Anatomic variants: No anatomic variant. No aneurysm or vascular malformation. Delayed phase: No pathologic enhancement on delayed sequence. IMPRESSION: CTA NECK IMPRESSION: 1. No high-grade or critical stenosis identified within the major arterial vasculature of the neck. 2. Minimal noncalcified atheromatous plaque about the right carotid bifurcation without stenosis. 3. Dominant left vertebral artery. CTA HEAD IMPRESSION: 1. No emergent large or proximal arterial branch occlusion within the intracranial circulation. No high-grade or correctable stenosis. 2. Atheromatous plaque within the cavernous ICAs with secondary mild diffuse narrowing (less than 25%). 3. Mild smooth narrowing of the distal left M1 segment. 4. Short-segment moderate stenosis within the distal left P2 segment, with mild atheromatous irregularity  proximally. Electronically Signed   By: Jeannine Boga M.D.   On: 12/14/2015 00:40   Dg Chest 2 View  12/13/2015  CLINICAL DATA:  Loss of consciousness.  Shortness of breath. EXAM: CHEST  2 VIEW COMPARISON:  December 26, 2014. FINDINGS: The heart size and mediastinal contours are within normal limits. Both lungs are clear. No pneumothorax or pleural effusion is noted. Left-sided pacemaker is unchanged in position. The visualized skeletal structures are unremarkable. IMPRESSION: No active cardiopulmonary disease. Electronically Signed   By: Marijo Conception, M.D.   On: 12/13/2015 19:37   Ct Head Wo Contrast  12/14/2015  CLINICAL DATA:  65 year old  hypertensive male post tPA epidural loss of consciousness and dizziness. Left-sided weakness. Pacemaker and corneae stents in place. Subsequent encounter. EXAM: CT HEAD WITHOUT CONTRAST TECHNIQUE: Contiguous axial images were obtained from the base of the skull through the vertex without intravenous contrast. COMPARISON:  Head CT and CT angiogram 12/13/2014. FINDINGS: No intracranial hemorrhage. Small vessel disease changes without CT evidence of large acute infarct. No intracranial mass lesion noted on this unenhanced exam. Vascular calcifications. Minimal mucosal thickening ethmoid sinus air cells. IMPRESSION: No intracranial hemorrhage. Chronic microvascular changes without CT evidence of large acute infarct. Electronically Signed   By: Steven  Olson M.D.   On: 12/14/2015 08:12   Ct Head Wo Contrast  12/13/2015  CLINICAL DATA:  Code stroke. Loss consciousness today at home after some dizziness at 4:30 p.m. Hit head on floor. Left-sided weakness. EXAM: CT HEAD WITHOUT CONTRAST TECHNIQUE: Contiguous axial images were obtained from the base of the skull through the vertex without intravenous contrast. COMPARISON:  12/26/2014 FINDINGS: Ventricles, cisterns and other CSF spaces are within normal. No evidence of mass, mass effect, shift of midline structures or acute  hemorrhage. No evidence of acute infarction. Remaining bones and soft tissues are within normal. IMPRESSION: No acute intracranial findings. These results were called by telephone at the time of interpretation on 12/13/2015 at 8:22 pm to Dr. ERIN SCHLOSSMAN , who verbally acknowledged these results. Electronically Signed   By: Daniel  Boyle M.D.   On: 12/13/2015 20:22   Ct Angio Neck W/cm &/or Wo/cm  12/14/2015  CLINICAL DATA:  Initial valuation for acute syncope, left-sided weakness. EXAM: CT ANGIOGRAPHY HEAD AND NECK TECHNIQUE: Multidetector CT imaging of the head and neck was performed using the standard protocol during bolus administration of intravenous contrast. Multiplanar CT image reconstructions and MIPs were obtained to evaluate the vascular anatomy. Carotid stenosis measurements (when applicable) are obtained utilizing NASCET criteria, using the distal internal carotid diameter as the denominator. CONTRAST: 50 cc of Isovue 370. COMPARISON:  Prior head CT from earlier the same day. FINDINGS: CTA NECK Aortic arch: Visualized aortic arch is of normal caliber with normal branch pattern. Minimal plaque within the arch itself. No high-grade stenosis at the origin of the great vessels. Visualized subclavian arteries are widely patent. Right carotid system: Right common carotid artery widely patent from its origin to the bifurcation. Minimal noncalcified plaque about the right bifurcation without significant stenosis. Right ICA widely patent from the bifurcation to the skullbase. No flow limiting stenosis, dissection, or vascular occlusion within the right carotid artery system. Right external carotid artery and its branches within normal limits. Left carotid system: Left common carotid artery widely patent from its origin to the bifurcation. No significant plaque about the left bifurcation. Left ICA widely patent from the bifurcation to the skullbase. No dissection, stenosis, or vascular occlusion within the  left carotid artery system. Left external carotid artery and its branches within normal limits. Vertebral arteries:Both vertebral arteries arise from the subclavian arteries. Left vertebral artery is dominant. Vertebral arteries are widely patent without stenosis, dissection, or occlusion. Skeleton: No acute osseous abnormality. No worrisome lytic or blastic osseous lesions. Mild degenerative spondylolysis at C5-6 and C6-7. Other neck: Mild atelectatic changes seen dependently within the visualized lungs. Visualized lungs are otherwise clear. Visualized superior mediastinum within normal limits. Thyroid gland normal. No adenopathy within the neck. No acute soft tissue abnormality. CTA HEAD Anterior circulation: The petrous segments widely patent bilaterally. Scattered calcified atheromatous plaque within the cavernous/ supraclinoid ICAs with mild diffuse narrowing (  less than 25%). A1 segments widely patent. Anterior communicating artery normal. Anterior cerebral arteries well opacified to their distal aspects. M1 segments patent without occlusion. Mild smooth narrowing of the distal left M1 segment (series 705, image 16). Right M1 segment widely patent. MCA bifurcations normal. No proximal M2 branch stenosis or occlusion. Distal MCA branches well opacified and symmetric. Posterior circulation: Dominant left vertebral artery widely patent to the vertebrobasilar junction. Minimal plaque within the left V4 segment without stenosis. Hypoplastic right vertebral artery largely terminates and high Ki, although a small hypoplastic branch ascends towards the vertebrobasilar junction. Posterior inferior cerebral arteries themselves are patent. Basilar artery widely patent. Superior cerebellar arteries well opacified bilaterally. Both of the posterior cerebral arteries arise from the basilar artery and are well opacified to their distal aspects. Moderate short-segment stenosis of the right P2 segment just prior to its  bifurcation (series 706, image 15). Mild atheromatous irregularity within the right P2 segment proximally. Venous sinuses: Patent without evidence for venous sinus thrombosis. Anatomic variants: No anatomic variant. No aneurysm or vascular malformation. Delayed phase: No pathologic enhancement on delayed sequence. IMPRESSION: CTA NECK IMPRESSION: 1. No high-grade or critical stenosis identified within the major arterial vasculature of the neck. 2. Minimal noncalcified atheromatous plaque about the right carotid bifurcation without stenosis. 3. Dominant left vertebral artery. CTA HEAD IMPRESSION: 1. No emergent large or proximal arterial branch occlusion within the intracranial circulation. No high-grade or correctable stenosis. 2. Atheromatous plaque within the cavernous ICAs with secondary mild diffuse narrowing (less than 25%). 3. Mild smooth narrowing of the distal left M1 segment. 4. Short-segment moderate stenosis within the distal left P2 segment, with mild atheromatous irregularity proximally. Electronically Signed   By: Benjamin  McClintock M.D.   On: 12/14/2015 00:40   Ct Angio Chest/abd/pel For Dissection W And/or W/wo  12/13/2015  CLINICAL DATA:  Loss of consciousness today at home with some dizziness around 4:30 p.m. Hit head on 4 with left-sided weakness. Shortness of breath. Left arm pain and tingling since 5 p.m. EXAM: CT VENOGRAM CHEST-ABD-PELV FOR DISSECTION W/ AND WO/W CM TECHNIQUE: Axial images were obtained pre contrast administration through the chest with post-contrast axial images CT chest, abdomen and pelvis with coronal and sagittal reformatted images. CONTRAST:  100 mL Isovue 370 IV. COMPARISON:  CT abdomen/ pelvis 12/26/2014 and chest x-ray 12/13/2015 FINDINGS: CT CHEST FINDINGS: The precontrast images unremarkable. Lungs are well inflated without consolidation or effusion. Subtle bilateral posterior dependent atelectasis. Airways are within normal. Left-sided pacemaker is present. Mild  prominence of the heart. No evidence pericardial fluid. Evidence of patient's previous coronary artery stents over the region of the left anterior descending and lateral circumflex arteries. Mild prominence of the ascending thoracic aorta measuring 3.7 cm in AP diameter. No evidence dissection involving the thoracic aorta. Common takeoff of the left common carotid artery and right brachiocephalic artery from the arch. Great vessels are otherwise within normal. Focal calcification along the undersurface of the arch just distal to the takeoff of the left subclavian artery. Mild relative narrowing of the arch just distal to the takeoff of the left subclavian artery. Descending thoracic aorta is normal. No hilar or mediastinal adenopathy. Remaining mediastinal structures are within normal. CT ABDOMEN/PELVIS FINDINGS: Abdominal aorta is normal in caliber very minimal calcified atherosclerotic plaque. There is no evidence of aneurysm dilatation or dissection. The celiac axis, superior mesenteric artery and inferior mesenteric artery and branches are patent. Renal arteries are patent. There is an accessory left renal artery. Iliac arteries are   patent. Evidence of previous cholecystectomy. The liver, spleen, pancreas and adrenal glands are normal. Kidneys normal in size without hydronephrosis or nephrolithiasis. There are a couple sub cm bilateral renal cortical hypodensities too small to characterize but likely cysts. There is diverticulosis of the colon. Appendix is normal. Small bowel is within normal. Stomach is normal. Pelvic images demonstrate the bladder, prostate and rectum to be normal. There are minimal degenerate changes of the spine and hips. IMPRESSION: CT CHEST IMPRESSION: No acute findings.  No evidence of thoracic aortic dissection. 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 Recommend annual imaging followup by CTA or MRA. This recommendation follows 2010  ACCF/AHA/AATS/ACR/ASA/SCA/SCAI/SIR/STS/SVM Guidelines for the Diagnosis and Management of Patients with Thoracic Aortic Disease. Circulation.2010; 121: E423-N361. CT ABDOMEN/PELVIS IMPRESSION: No acute findings in the abdomen/pelvis. No evidence abdominal aortic aneurysm or dissection. Diverticulosis of the colon. A few tiny sub cm renal cortical hypodensities too small to characterize but likely cysts. Electronically Signed   By: Marin Olp M.D.   On: 12/13/2015 20:56     Assessment/Plan   ICD-9-CM ICD-10-CM   1. Gastroesophageal reflux disease, esophagitis presence not specified 530.81 K21.9   2. Stroke-like episode ([place]) s/p IV tPA; left weakness resolved 434.91 I63.9   3. Adjustment disorder with mixed anxiety and depressed mood - new 309.28 F43.23   4. Chronic diastolic heart failure (HCC) 428.32 I50.32   5. Pacemaker V45.01 Z95.0   6. Essential hypertension 401.9 I10   7. Hyperlipidemia 272.4 E78.5   8. Prediabetes 790.29 R73.03     Start zofran 27m q8hr prn nausea  Start protonix 434mdaily  Cont other meds as ordered   May need psych services. He currently is not taking any medication for d/o  PT/OT/ST as ordered  Follow VS and start fall precautions due to hx syncope/fainting  F/u with specialists as scheduled  GOAL: short term rehab and d/c home when medically appropriate. Communicated with pt and nursing.  Will follow  Mayari Matus S. CaPerlie [facility]PiS[facility] [address] [phone] (Monday-Friday 8 AM - 5 PM) ([phone] 5 PM and follow prompts

## 2015-12-23 ENCOUNTER — Non-Acute Institutional Stay (SKILLED_NURSING_FACILITY): Payer: Medicare Other | Admitting: Adult Health

## 2015-12-23 ENCOUNTER — Encounter: Payer: Self-pay | Admitting: Adult Health

## 2015-12-23 DIAGNOSIS — R2990 Unspecified symptoms and signs involving the nervous system: Secondary | ICD-10-CM

## 2015-12-23 DIAGNOSIS — I5032 Chronic diastolic (congestive) heart failure: Secondary | ICD-10-CM | POA: Diagnosis not present

## 2015-12-23 DIAGNOSIS — I639 Cerebral infarction, unspecified: Secondary | ICD-10-CM

## 2015-12-23 DIAGNOSIS — F4323 Adjustment disorder with mixed anxiety and depressed mood: Secondary | ICD-10-CM | POA: Diagnosis not present

## 2015-12-23 NOTE — Progress Notes (Signed)
Patient ID: Jacob Pineda, male   DOB: 06/13/1951, 65 y.o.   MRN: [id number]    Location:  [facility] Room Number: 109-A Place of Service:  SNF (31)   CODE STATUS: Full Code  No Known Allergies  Chief Complaint  Patient presents with  . Discharge Note    Discharge from Facility    HPI:  He is being discharged to home with home health for pt/ot/rn. He will not need any dme. He will need his prescriptions to be written and will need to follow up with his medical provider. He had been hospitalized for stroke like symptoms. He was admitted to this facility for short term rehab.    Past Medical History  Diagnosis Date  . Coronary artery disease   . Pacemaker   . Diastolic dysfunction   . Stroke [facility])     Past Surgical History  Procedure Laterality Date  . Pacemaker insertion    . Cardiac stents times 2    . Cholecystectomy    . Left heart catheterization with coronary angiogram N/A 07/25/2013    Procedure: LEFT HEART CATHETERIZATION WITH CORONARY ANGIOGRAM;  Surgeon: Laurey Morale, MD;  Location: [facility] CATH LAB;  Service: Cardiovascular;  Laterality: N/A;    Social History   Social History  . Marital Status: Widowed    Spouse Name: N/A  . Number of Children: N/A  . Years of Education: N/A   Occupational History  . Not on file.   Social History Main Topics  . Smoking status: Former Games developer  . Smokeless tobacco: Not on file  . Alcohol Use: No  . Drug Use: No  . Sexual Activity: Yes   Other Topics Concern  . Not on file   Social History Narrative   No family history on file.    VITAL SIGNS BP 120/80 mmHg  Pulse 80  Temp(Src) 98 F (36.7 C) (Oral)  Resp 18  Ht  (1.803 m)  Wt 288 lb 2 oz (130.693 kg)  BMI 40.20 kg/m2  Patient's Medications  New Prescriptions   No medications on file  Previous Medications   ACETAMINOPHEN (TYLENOL) 500 MG TABLET    Take 1,000 mg by mouth daily as needed for mild pain.    ASPIRIN EC 81 MG TABLET    Take 81 mg by mouth daily.   ATORVASTATIN (LIPITOR) 80 MG TABLET    Take 1 tablet (80 mg total) by mouth daily at 6 PM.   MULTIPLE VITAMIN (MULTIVITAMIN WITH MINERALS) TABS    Take 1 tablet by mouth daily.   ONDANSETRON (ZOFRAN) 4 MG TABLET    Take 4 mg by mouth every 8 (eight) hours as needed for nausea or vomiting.   PANTOPRAZOLE (PROTONIX) 40 MG TABLET    Take 40 mg by mouth daily.  Modified Medications   No medications on file  Discontinued Medications   No medications on file     SIGNIFICANT DIAGNOSTIC EXAMS  LABS REVIEWED:   12-14-15: chol 140; ldl 87; trig 52; hdl 43; hgb a1c 5.8   Review of Systems  Constitutional: Negative for malaise/fatigue.  Respiratory: Negative for cough and shortness of breath.   Cardiovascular: Negative for chest pain, palpitations and leg swelling.  Gastrointestinal: Negative for heartburn, abdominal pain and constipation.  Musculoskeletal: Negative for myalgias, back pain and joint pain.  Skin: Negative.   Neurological: Negative for dizziness.  Psychiatric/Behavioral: The patient is not nervous/anxious.     Physical Exam  Constitutional:  He is oriented to person, place, and time. No distress.  Eyes: Conjunctivae are normal.  Neck: Neck supple. No JVD present. No thyromegaly present.  Cardiovascular: Normal rate, regular rhythm and intact distal pulses.   Respiratory: Effort normal and breath sounds normal. No respiratory distress. He has no wheezes.  GI: Soft. Bowel sounds are normal. He exhibits no distension. There is no tenderness.  Musculoskeletal: He exhibits no edema.  Able to move all extremities  Able to ambulate   Lymphadenopathy:    He has no cervical adenopathy.  Neurological: He is alert and oriented to person, place, and time.  Skin: Skin is warm and dry. He is not diaphoretic.  Psychiatric: He has a normal mood and affect.  '    ASSESSMENT/ PLAN:  Will discharge to home with home health for  pt/ot/rn to evaluate and treat as indicated for gait balance; strength and adl training and medication management. He will not need dme. His prescriptions have been written for a 30 day supply of his medications. The facility is to setup his medical follow up in 1-2 weeks after discharge from facility.    Time spent with patient  40  minutes >50% time spent counseling; reviewing medical record; tests; labs; and developing future plan of care   Synthia InnocentDeborah Meryl Ponder NP [facility]  Contact [phone] Monday through Friday 8am- 5pm  After hours call [phone]

## 2015-12-26 DIAGNOSIS — R7303 Prediabetes: Secondary | ICD-10-CM | POA: Insufficient documentation

## 2015-12-27 ENCOUNTER — Encounter: Payer: Self-pay | Admitting: Internal Medicine

## 2015-12-31 NOTE — Progress Notes (Signed)
Late entry for missed Modified Rankin Score. Based on review of medical record.   12/17/15 1128  Modified Rankin (Stroke Patients Only)  Pre-Morbid Rankin Score 0  Modified Rankin [address]  Ellice Boultinghouse [place], [place]  [phone] 12/31/2015

## 2016-03-24 ENCOUNTER — Other Ambulatory Visit: Payer: Self-pay

## 2016-03-24 NOTE — Patient Outreach (Signed)
[facility] Care Management  03/24/2016  Jacob SchoolsDarrell W Pineda 12-15-1950 [id number]    Outreach made to confirm 90 day Modified Rankin Survey; number on file is non-working and so is the number of the emergency contact. A score of 7 will be documented due to "unable to reach". Jacob SellerMichelle W. [name], MHA [facility] [phone]

## 2018-01-27 ENCOUNTER — Other Ambulatory Visit: Payer: Self-pay

## 2018-01-27 ENCOUNTER — Observation Stay (HOSPITAL_COMMUNITY)
Admission: EM | Admit: 2018-01-27 | Discharge: 2018-01-28 | Disposition: A | Discharge status: Home / Self Care | Payer: Medicare Other | Attending: Internal Medicine | Admitting: Internal Medicine

## 2018-01-27 ENCOUNTER — Emergency Department (HOSPITAL_COMMUNITY): Payer: Medicare Other

## 2018-01-27 ENCOUNTER — Encounter (HOSPITAL_COMMUNITY): Payer: Self-pay | Admitting: Emergency Medicine

## 2018-01-27 DIAGNOSIS — F6812 Factitious disorder with predominantly physical signs and symptoms: Secondary | ICD-10-CM | POA: Diagnosis not present

## 2018-01-27 DIAGNOSIS — Z950 Presence of cardiac pacemaker: Secondary | ICD-10-CM | POA: Insufficient documentation

## 2018-01-27 DIAGNOSIS — Z79899 Other long term (current) drug therapy: Secondary | ICD-10-CM | POA: Diagnosis not present

## 2018-01-27 DIAGNOSIS — R079 Chest pain, unspecified: Principal | ICD-10-CM | POA: Insufficient documentation

## 2018-01-27 DIAGNOSIS — R531 Weakness: Secondary | ICD-10-CM

## 2018-01-27 DIAGNOSIS — Z765 Malingerer [conscious simulation]: Secondary | ICD-10-CM | POA: Diagnosis not present

## 2018-01-27 DIAGNOSIS — Z8673 Personal history of transient ischemic attack (TIA), and cerebral infarction without residual deficits: Secondary | ICD-10-CM | POA: Diagnosis not present

## 2018-01-27 DIAGNOSIS — Z8719 Personal history of other diseases of the digestive system: Secondary | ICD-10-CM | POA: Insufficient documentation

## 2018-01-27 DIAGNOSIS — Z7982 Long term (current) use of aspirin: Secondary | ICD-10-CM | POA: Diagnosis not present

## 2018-01-27 DIAGNOSIS — I2510 Atherosclerotic heart disease of native coronary artery without angina pectoris: Secondary | ICD-10-CM | POA: Insufficient documentation

## 2018-01-27 LAB — TROPONIN I: Troponin I: <0.03 ng/mL

## 2018-01-27 LAB — COMPREHENSIVE METABOLIC PANEL
ALK PHOS: 57 U/L (ref 38–126)
ALT: 77 U/L — ABNORMAL HIGH (ref 0–44)
ANION GAP: 9 (ref 5–15)
AST: 35 U/L (ref 15–41)
Albumin: 4.1 g/dL (ref 3.5–5.0)
BILIRUBIN TOTAL: 1.2 mg/dL (ref 0.3–1.2)
BUN: 15 mg/dL (ref 8–23)
CO2: 22 mmol/L (ref 22–32)
Calcium: 9.5 mg/dL (ref 8.9–10.3)
Chloride: 110 mmol/L (ref 98–111)
Creatinine, Ser: 1.19 mg/dL (ref 0.61–1.24)
GLUCOSE: 125 mg/dL — AB (ref 70–99)
POTASSIUM: 3.9 mmol/L (ref 3.5–5.1)
Sodium: 141 mmol/L (ref 135–145)
TOTAL PROTEIN: 7.1 g/dL (ref 6.5–8.1)

## 2018-01-27 LAB — CBC WITH DIFFERENTIAL/PLATELET
Abs Immature Granulocytes: 0 10*3/uL (ref 0.0–0.1)
Basophils Absolute: 0 10*3/uL (ref 0.0–0.1)
Basophils Relative: 1 %
EOS ABS: 0.1 10*3/uL (ref 0.0–0.7)
Eosinophils Relative: 1 %
HEMATOCRIT: 42.7 % (ref 39.0–52.0)
Hemoglobin: 13.7 g/dL (ref 13.0–17.0)
IMMATURE GRANULOCYTES: 0 %
LYMPHS ABS: 1.4 10*3/uL (ref 0.7–4.0)
Lymphocytes Relative: 24 %
MCH: 31.6 pg (ref 26.0–34.0)
MCHC: 32.1 g/dL (ref 30.0–36.0)
MCV: 98.4 fL (ref 78.0–100.0)
MONO ABS: 0.7 10*3/uL (ref 0.1–1.0)
Monocytes Relative: 13 %
NEUTROS PCT: 61 %
Neutro Abs: 3.7 10*3/uL (ref 1.7–7.7)
Platelets: 175 10*3/uL (ref 150–400)
RBC: 4.34 MIL/uL (ref 4.22–5.81)
RDW: 14.4 % (ref 11.5–15.5)
WBC: 5.9 10*3/uL (ref 4.0–10.5)

## 2018-01-27 LAB — URINALYSIS, ROUTINE W REFLEX MICROSCOPIC
BILIRUBIN URINE: NEGATIVE
Bacteria, UA: NONE SEEN
GLUCOSE, UA: NEGATIVE mg/dL
KETONES UR: NEGATIVE mg/dL
LEUKOCYTES UA: NEGATIVE
NITRITE: NEGATIVE
PH: 5 (ref 5.0–8.0)
Protein, ur: NEGATIVE mg/dL
SPECIFIC GRAVITY, URINE: 1.029 (ref 1.005–1.030)

## 2018-01-27 LAB — I-STAT TROPONIN, ED: TROPONIN I, POC: 0 ng/mL (ref 0.00–0.08)

## 2018-01-27 MED ORDER — POLYETHYLENE GLYCOL 3350 17 G PO PACK: 17.0000 g | PACK | Freq: Every day | ORAL | Status: DC | PRN

## 2018-01-27 MED ORDER — ACETAMINOPHEN 325 MG PO TABS
650.0000 mg | ORAL_TABLET | Freq: Four times a day (QID) | ORAL | Status: DC | PRN
  Administered 2018-01-27: 650 mg via ORAL
  Filled 2018-01-27: qty 2

## 2018-01-27 MED ORDER — ONDANSETRON HCL 4 MG/2ML IJ SOLN: 4.0000 mg | Freq: Four times a day (QID) | INTRAMUSCULAR | Status: DC | PRN

## 2018-01-27 MED ORDER — ATORVASTATIN CALCIUM 80 MG PO TABS
80.0000 mg | ORAL_TABLET | Freq: Every day | ORAL | Status: DC
  Administered 2018-01-27: 80 mg via ORAL
  Filled 2018-01-27: qty 1

## 2018-01-27 MED ORDER — ACETAMINOPHEN 650 MG RE SUPP: 650.0000 mg | Freq: Four times a day (QID) | RECTAL | Status: DC | PRN

## 2018-01-27 MED ORDER — ONDANSETRON HCL 4 MG PO TABS: 4.0000 mg | ORAL_TABLET | Freq: Four times a day (QID) | ORAL | Status: DC | PRN

## 2018-01-27 MED ORDER — ASPIRIN EC 81 MG PO TBEC
81.0000 mg | DELAYED_RELEASE_TABLET | Freq: Every day | ORAL | Status: DC
  Administered 2018-01-27: 81 mg via ORAL
  Filled 2018-01-27: qty 1

## 2018-01-27 NOTE — ED Notes (Signed)
Attempted to call report

## 2018-01-27 NOTE — Consult Note (Signed)
NEURO HOSPITALIST CONSULT NOTE   Requestig physician: Dr. Rodena Medin  Reason for Consult: Left sided weakness  History obtained from:  Patient and Chart     HPI:                                                                                                                                          Jacob Pineda is an 67 y.o. male presenting to the ED with multiple complaints, including CP, SOB and a fainting spell with LOC after which he felt increased weakness on his left side relative to his baseline. He has a history of stroke with residual left sided weakness. He is being admitted for a chest pain work up. Neurology consulted to evaluate given complaint of left sided worsened weakness. Of note, the patient has been admitted in the past for subjectively worsened neurological deficit, with negative work up. He has received tPA twice in the past for almost identical complaints. Per Dr. Warren Danes discharge summary from May 2017, evaluations at other hospitals for stroke have documented concern for embellishment of symptoms and examination findings, including inconsistencies on motor exam such as positive Hoover's sign as well as inconsistencies on sensory exams. Dr. Warren Danes note also documented exam consistent with functional component.    Past Medical History:  Diagnosis Date  . Coronary artery disease   . Diastolic dysfunction   . Pacemaker   . Stroke [facility])     Past Surgical History:  Procedure Laterality Date  . cardiac stents times 2    . CHOLECYSTECTOMY    . LEFT HEART CATHETERIZATION WITH CORONARY ANGIOGRAM N/A 07/25/2013   Procedure: LEFT HEART CATHETERIZATION WITH CORONARY ANGIOGRAM;  Surgeon: Laurey Morale, MD;  Location: [facility] CATH LAB;  Service: Cardiovascular;  Laterality: N/A;  . PACEMAKER INSERTION      Family History  Problem Relation Age of Onset  . Cancer Mother        breast              Social History:  reports that he has quit smoking. He does not  have any smokeless tobacco history on file. He reports that he does not drink alcohol or use drugs.  Allergies  Allergen Reactions  . Morphine Hives and Itching  . Iodinated Diagnostic Agents Rash    Patient states he can tolerate contrast if premedicated with benadryl and steroids.  Patient states he can tolerate contrast if premedicated with benadryl and steroids.  Patient states he can tolerate contrast if premedicated with benadryl and steroids.      HOME MEDICATIONS:  ROS:                                                                                                                                       Positive for chest pain. Other ROS as per HPI.    Blood pressure 115/70, pulse 67, temperature 98.5 F (36.9 C), temperature source Oral, resp. rate 19, height 5\' 11"  (1.803 m), weight 81.6 kg (180 lb), SpO2 100 %.   General Examination:                                                                                                       Physical Exam  HEENT-  [place]/AT  Lungs- Respirations unlabored Extremities- Warm and well perfused  Neurological Examination Mental Status: Alert, answers inconsistently to orientation questions. No expressive or receptive aphasia. Able to follow all commands without difficulty. Cranial Nerves: II: Visual fields grossly intact to bedside confrontation without extinction to DSS. PERRL.   III,IV, VI: Intermittent right eye squinting, which does not appear consistent with a physiological ptosis. EOMI without nystagmus.  V,VII: No facial droop. Facial light touch sensation subjectively "abnormal" on the left per patient VIII: hearing intact to voice IX,X: No hoarseness. Intermittent hypophonic speech.  XI: Head at midline XII: midline tongue extension Motor: Maximum strength elicitable when examiner rapidly changes direction  and magnitude of applied force is 5/5 proximally and distally in upper and lower extremities. When testing muscle groups individually without the above maneuvers, the patient exhibits poor effort with intermittent 4/5 strength in LUE and LLE. Tone and bulk are normal x 4, without asymmetry.  Exhibits rapid drop of LUE when testing pronator drift, which appears embellished/nonphysiological. When distracted, spontaneous left arm movement is normal without drift.  Sensory: Light touch sensation subjectively "abnormal" to LUE and LLE per patient Deep Tendon Reflexes: 2+ and symmetric biceps, brachioradialis, patellae and achilles bilaterally.  Plantars: Right: downgoing   Left: downgoing Cerebellar: No ataxia with FNF bilaterally. Performs somewhat slower on left than right.  Gait: Deferred   Lab Results: Basic Metabolic Panel: Recent Labs  Lab 01/27/18 0749  NA 141  K 3.9  CL 110  CO2 22  GLUCOSE 125*  BUN 15  CREATININE 1.19  CALCIUM 9.5    CBC: Recent Labs  Lab 01/27/18 0749  WBC 5.9  NEUTROABS 3.7  HGB 13.7  HCT 42.7  MCV 98.4  PLT 175    Cardiac Enzymes: No results for input(s): CKTOTAL, CKMB, CKMBINDEX, TROPONINI in the last  168 hours.  Lipid Panel: No results for input(s): CHOL, TRIG, HDL, CHOLHDL, VLDL, LDLCALC in the last 168 hours.  Imaging: Dg Chest 2 View  Result Date: 01/27/2018 CLINICAL DATA:  Chest pain EXAM: CHEST - 2 VIEW COMPARISON:  12/24/2015 chest radiograph. FINDINGS: Stable configuration of 2 lead left subclavian pacemaker. Left-sided coronary stents. Stable cardiomediastinal silhouette with normal heart size. No pneumothorax. No pleural effusion. Lungs appear clear, with no acute consolidative airspace disease and no pulmonary edema. IMPRESSION: No active cardiopulmonary disease. Electronically Signed   By: Delbert PhenixJason A Poff M.D.   On: 01/27/2018 08:26   Ct Head Wo Contrast  Result Date: 01/27/2018 CLINICAL DATA:  Fall with questionable loss of  consciousness. Left-sided weakness EXAM: CT HEAD WITHOUT CONTRAST TECHNIQUE: Contiguous axial images were obtained from the base of the skull through the vertex without intravenous contrast. COMPARISON:  Dec 14, 2015 FINDINGS: Brain: There is age related volume loss. There is no intracranial mass, hemorrhage, extra-axial fluid collection, or midline shift. There is patchy small vessel disease in the centra semiovale bilaterally, stable. No evident acute infarct. Vascular: No hyperdense vessel evident. There is calcification in the distal left vertebral artery as well as in the carotid siphon regions bilaterally. Skull: Bony calvarium appears intact. Sinuses/Orbits: There is mucosal thickening in several ethmoid air cells. A small osteoma is noted in the right ethmoid air cell complex, stable. Other visualized paranasal sinuses are clear. Orbits appear symmetric bilaterally. Other: Mastoid air cells are clear. There is debris in the right external auditory canal. IMPRESSION: Patchy periventricular small vessel disease. No acute infarct evident. No mass or hemorrhage. There are foci of arterial vascular calcification. There is mucosal thickening in several ethmoid air cells with a small osteoma in the right ethmoid region, stable. Probable cerumen in the right external auditory canal. Electronically Signed   By: Bretta BangWilliam  Woodruff III M.D.   On: 01/27/2018 08:31    Assessment: 67 year old male 1. Exam reveals multiple inconsistencies, most consistent with embellishment. No definite focal findings seen. Maximum strength elicitable is 5/5.  2. CT shows no acute abnormality. Patchy periventricular small vessel disease and foci of arterial vascular calcification are noted.   Recommendations: 1. Observation and psychology consult. 2. Continue ASA and Lipitor.  3. Unable to obtain MRI due to pacemaker.   Electronically signed: Dr. Caryl PinaEric Ryman Rathgeber 01/27/2018, 9:26 AM

## 2018-01-27 NOTE — H&P (Signed)
Date: 01/27/2018               Patient Name:  Jacob Pineda MRN: [id number]  DOB: 1950-08-10 Age / Sex: 67 y.o., male   PCP: Default, Provider, MD         Medical Service: Internal Medicine Teaching Service         Attending Physician: Dr. Doneen Poisson, MD    First Contact: Dr. Cleaster Pineda Pager: [phone]  Second Contact: Dr. Nelson Pineda Pager: [phone]       After Hours (After 5p/  First Contact Pager: [phone]  weekends / holidays): Second Contact Pager: [phone]   Chief Complaint: Syncope  History of Present Illness: Jacob Pineda is Pineda 67 yo male w history of stroke, CAD, previous stent placementx2, pacemaker, and HTN who presents today with syncope, new onset left sided weakness, and chest pain. He states he was standing at work, not doing anything, around 0430am when he suddenly had witnessed lost consciousness and fell. He states he was out for around 2 minutes and did not hit his head. Pt states he was having chest pain to this morning previous to LOC. After LOC he tried to stand and fell again without LOC. He did not hit head but stated he had some SOB and continued CP that he described as stabbing. He has occasional chest pain similar to this, usually when he is not doing anything. When he regained consciousness he had weakness and numbness of his left arm and left leg, numbness of the left side of his face, and reduced vision in his left eye all reduced from baseline. He has not had any diaphoresis, nausea, vomiting. He stated at one point he had no left arm pain, but later said that he had arm pain after his fall. He states that he usually does not have dizziness when going from sitting to standing. He also has Pineda large bruise on his right forearm that he states started after his fall this am.  Pt states that he has fallen several times before. He went to [facility] in [place], [place] about Pineda month ago due to chest pain. He was put on eliquis at this time, but he is unsure why  he is taking it.   Meds:  Current Meds  Medication Sig  . Acetaminophen 325 MG CAPS Take 325 mg by mouth daily as needed for mild pain.   Marland Kitchen Apixaban (ELIQUIS PO) Take 1 tablet by mouth daily.  Marland Kitchen aspirin EC 81 MG tablet Take 81 mg by mouth daily.  . Multiple Vitamin (MULTIVITAMIN WITH MINERALS) TABS Take 1 tablet by mouth daily.     Allergies: Allergies as of 01/27/2018 - Review Complete 01/27/2018  Allergen Reaction Noted  . Morphine Hives and Itching 05/14/2012  . Iodinated diagnostic agents Rash 08/30/2013   Past Medical History:  Diagnosis Date  . Coronary artery disease   . Diastolic dysfunction   . Pacemaker   . Stroke [facility])     Family History:  Mother: passed away from breast cancer at 72 Sister: hx of breast cancer  Social History:  Smokes 1 pack over six month periods Denies alcohol  Denies drug use   Review of Systems: Pineda complete ROS was negative except as per HPI.   Physical Exam: Blood pressure 124/80, pulse 61, temperature 98 F (36.7 C), resp. rate 15, height 5\' 11"  (1.803 m), weight 180 lb (81.6 kg), SpO2 96 %.  Physical Exam  Constitutional: He is oriented to  person, place, and time and well-developed, well-nourished, and in no distress. Vital signs are normal.  HENT:  Head: Normocephalic and atraumatic.  Eyes: Pupils are equal, round, and reactive to light. Left eye exhibits normal extraocular motion.  Does not look left on eye exam  Cardiovascular: Normal rate, regular rhythm, normal heart sounds and normal pulses.  Regular pacemaker spikes on EKG  Pulmonary/Chest: Breath sounds normal. No accessory muscle usage. No tachypnea. No respiratory distress.  Abdominal: Soft. Bowel sounds are normal. He exhibits no distension. There is no tenderness. There is no guarding.  Musculoskeletal:  Right UE & LE 5/5 strength and +sensation  Left UE & LE inconsistent decrease in strength. 3/5 to flex leg against resistance, assisted in leg flexion movement for  physician hand placement for extension against resistance  Neurological: He is alert and oriented to person, place, and time.  CN II - VI, VI - XII intact no sensation on left side of face Cerebellar test negative  Skin:        EKG: personally reviewed my interpretation is normal paced rhythm  CXR: personally reviewed my interpretation is no acute findings  CT: personally reviewed, my interpretation is no acute findings   Assessment & Plan by Problem: Active Problems:   Left-sided weakness   Left-sided weakness: Pt presented this morning after witnessed syncopal episode at work lasting two minutes per pt. Left sided weakness and numbness UE & LE with numbness on left side of face & decreased left vision. PE inconsistent with described symptoms, change with distraction . He has Pineda history of presenting with similar symptoms with negative findings dating back to 2013 with extensive workup over time. CT and EKG today normal.  - admit overnight for observation  -  It is unclear why he is taking both asa & eliquis. [facility] he is taking eliquis - cont. Asa & lipitor - No MRI as patient has pacemaker  Chest pain: Stabbing chest pain that began this morning, also occurs intermittently at random at home and work. Hx of stentsx2 2011 or 12; negative Cath & angiography in 2015, hx of CAD & stent placement 2012. 2017  - trend troponin - initial troponin negative     Dispo: Admit patient to Observation with expected length of stay less than 2 midnights.  SignedVersie Pineda: Jacob Eckard A, DO 01/27/2018, 1:17 PM  Pager: [phone]

## 2018-01-27 NOTE — ED Triage Notes (Signed)
Pt arrives via [facility] reporting CP and SOB, reports fall with possible LOC.  EMS reports giving 1 NTG and 324 ASA,, pt reports pain unchanged at 8/10.  After arrival pt reports L sided weakness, LUE drift noted.  Pt reports hx CVA with L sided deficits, unable to determine if pt has L drift at baseline. Resp e/u, NA noted at this time.  Dr Rodena MedinMessick at bedside.

## 2018-01-27 NOTE — ED Notes (Signed)
Pt reports L sided weakness in past, unclear about when symptoms resolved or reoccurred.  On arrival pt able to state birth date; at this time reports unable to remember.  This RN noted diffuse bruises abdomen. Pt reports being in the hospital a week and a half ago for stroke-like symptoms.  Pt reports desire to go to rehab to "build up my strength."

## 2018-01-27 NOTE — ED Provider Notes (Signed)
[facility] EMERGENCY DEPARTMENT Provider Note   CSN: [id number] Arrival date & time: 01/27/18  0710     History   Chief Complaint Chief Complaint  Patient presents with  . Chest Pain  . Weakness    HPI Jacob Pineda is a 67 y.o. male.  67 year old male with prior history of CAD, pacemaker placement, CHF, CVA, and hypertension who presents with complaint of syncopal event.  He reports that he was at [facility] earlier today when he "passed out" and fell to the floor.  He reports LOC. He reports that he then developed some substernal chest discomfort.  He also feels globally weak - more so on his left arm and left leg.  Patient reports prior strokes in the past with prior episodes of weakness to the left arm and left leg.  Patient appears to be in no acute distress during my evaluation.  The patient reports multiple recent admissions both here and in [place]. 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 He has no PMD.   The history is provided by the patient and medical records.  Chest Pain   This is a new problem. The current episode started 1 to 2 hours ago. The problem occurs rarely. The problem has not changed since onset.The pain is present in the substernal region. The pain is mild. The quality of the pain is described as sharp. The pain does not radiate. Associated symptoms include shortness of breath and weakness. Pertinent negatives include no back pain.  Weakness  Associated symptoms include shortness of breath and chest pain.    Past Medical History:  Diagnosis Date  . Coronary artery disease   . Diastolic dysfunction   . Pacemaker   . Stroke [facility])     Patient Active Problem List   Diagnosis Date Noted  . Prediabetes 12/26/2015  . Adjustment disorder with mixed anxiety and depressed mood 12/17/2015  . Hyperlipidemia 12/17/2015  . Stroke-like episode (HCC) s/p IV tPA 12/14/2015  . Faintness   . Right-sided chest wall pain 12/26/2014    . 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 Dysarthria 12/26/2014  . Stroke (HCC)   . Diastolic dysfunction   . Pacemaker   . Pain in the chest   . Headache   . Chronic diastolic heart failure (HCC)   . Dizziness 07/26/2013  . Syncope 07/25/2013  . History of bradycardia 07/25/2013  . CAD (coronary artery disease) 07/25/2013  . Unstable angina (HCC) 07/24/2013    Past Surgical History:  Procedure Laterality Date  . cardiac stents times 2    . CHOLECYSTECTOMY    . LEFT HEART CATHETERIZATION WITH CORONARY ANGIOGRAM N/A 07/25/2013   Procedure: LEFT HEART CATHETERIZATION WITH CORONARY ANGIOGRAM;  Surgeon: Laurey Morale, MD;  Location: [facility] CATH LAB;  Service: Cardiovascular;  Laterality: N/A;  . PACEMAKER INSERTION          Home Medications    Prior to Admission medications   Medication Sig Start Date End Date Taking? Authorizing Provider  acetaminophen (TYLENOL) 500 MG tablet Take 1,000 mg by mouth daily as needed for mild pain.     [provider]  aspirin EC 81 MG tablet Take 81 mg by mouth daily.    [provider]  atorvastatin (LIPITOR) 80 MG tablet Take 1 tablet (80 mg total) by mouth daily at 6 PM. 07/26/13   McLean-Scocuzza, Pasty Spillers, MD  Multiple Vitamin (MULTIVITAMIN WITH MINERALS) TABS Take 1 tablet by  mouth daily.    [provider]  ondansetron (ZOFRAN) 4 MG tablet Take 4 mg by mouth every 8 (eight) hours as needed for nausea or vomiting.    [provider]  pantoprazole (PROTONIX) 40 MG tablet Take 40 mg by mouth daily.    [provider]    Family History Family History  Problem Relation Age of Onset  . Cancer Mother        breast    Social History Social History   Tobacco Use  . Smoking status: Former Smoker  Substance Use Topics  . Alcohol use: No  . Drug use: No     Allergies   Morphine and Iodinated diagnostic agents   Review of Systems Review of Systems  Respiratory:  Positive for shortness of breath.   Cardiovascular: Positive for chest pain.  Musculoskeletal: Negative for back pain.  Neurological: Positive for weakness.  All other systems reviewed and are negative.    Physical Exam Updated Vital Signs BP 110/73   Pulse (!) 59   Temp 98.5 F (36.9 C) (Oral)   Resp 19   Ht 5\' 11"  (1.803 m)   Wt 81.6 kg (180 lb) Comment: pt denies ever weighing 280 lbs  SpO2 98%   BMI 25.10 kg/m   Physical Exam  Constitutional: He is oriented to person, place, and time. He appears well-developed and well-nourished. No distress.  HENT:  Head: Normocephalic and atraumatic.  Mouth/Throat: Oropharynx is clear and moist.  Eyes: Pupils are equal, round, and reactive to light. Conjunctivae and EOM are normal.  Neck: Normal range of motion. Neck supple.  Cardiovascular: Normal rate, regular rhythm and normal heart sounds.  Pulmonary/Chest: Effort normal and breath sounds normal. No respiratory distress.  Abdominal: Soft. He exhibits no distension. There is no tenderness.  Musculoskeletal: Normal range of motion. He exhibits no edema or deformity.  Ecchymosis to the right forearm   Neurological: He is alert and oriented to person, place, and time.  AOX4 Normal Speech Normal Affect No facial droop Normal gait VAN negative  Mild 4/5 Strength of LUE and LLE - which improves with distraction of the patient   Skin: Skin is warm and dry.  Psychiatric: He has a normal mood and affect.  Nursing note and vitals reviewed.    ED Treatments / Results  Labs (all labs ordered are listed, but only abnormal results are displayed) Labs Reviewed  COMPREHENSIVE METABOLIC PANEL - Abnormal; Notable for the following components:      Result Value   Glucose, Bld 125 (*)    ALT 77 (*)    All other components within normal limits  CBC WITH DIFFERENTIAL/PLATELET  URINALYSIS, ROUTINE W REFLEX MICROSCOPIC  I-STAT TROPONIN, ED    EKG EKG Interpretation  Date/Time:  Sunday  January 27 2018 07:25:43 EDT Ventricular Rate:  73 PR Interval:  216 QRS Duration: 86 QT Interval:  412 QTC Calculation: 453 R Axis:   6 Text Interpretation:  Atrial-paced rhythm with prolonged AV conduction Cannot rule out Anterior infarct , age undetermined Abnormal ECG Confirmed by Kristine Royal [phone]) on 01/27/2018 8:09:32 AM   Radiology Dg Chest 2 View  Result Date: 01/27/2018 CLINICAL DATA:  Chest pain EXAM: CHEST - 2 VIEW COMPARISON:  12/24/2015 chest radiograph. FINDINGS: Stable configuration of 2 lead left subclavian pacemaker. Left-sided coronary stents. Stable cardiomediastinal silhouette with normal heart size. No pneumothorax. No pleural effusion. Lungs appear clear, with no acute consolidative airspace disease and no pulmonary edema. IMPRESSION: No active  cardiopulmonary disease. Electronically Signed   By: Delbert PhenixJason A Poff M.D.   On: 01/27/2018 08:26   Ct Head Wo Contrast  Result Date: 01/27/2018 CLINICAL DATA:  Fall with questionable loss of consciousness. Left-sided weakness EXAM: CT HEAD WITHOUT CONTRAST TECHNIQUE: Contiguous axial images were obtained from the base of the skull through the vertex without intravenous contrast. COMPARISON:  Dec 14, 2015 FINDINGS: Brain: There is age related volume loss. There is no intracranial mass, hemorrhage, extra-axial fluid collection, or midline shift. There is patchy small vessel disease in the centra semiovale bilaterally, stable. No evident acute infarct. Vascular: No hyperdense vessel evident. There is calcification in the distal left vertebral artery as well as in the carotid siphon regions bilaterally. Skull: Bony calvarium appears intact. Sinuses/Orbits: There is mucosal thickening in several ethmoid air cells. A small osteoma is noted in the right ethmoid air cell complex, stable. Other visualized paranasal sinuses are clear. Orbits appear symmetric bilaterally. Other: Mastoid air cells are clear. There is debris in the right external auditory  canal. IMPRESSION: Patchy periventricular small vessel disease. No acute infarct evident. No mass or hemorrhage. There are foci of arterial vascular calcification. There is mucosal thickening in several ethmoid air cells with a small osteoma in the right ethmoid region, stable. Probable cerumen in the right external auditory canal. Electronically Signed   By: Bretta BangWilliam  Woodruff III M.D.   On: 01/27/2018 08:31    Procedures Procedures (including critical care time)  Medications Ordered in ED Medications - No data to display   Initial Impression / Assessment and Plan / ED Course  I have reviewed the triage vital signs and the nursing notes.  Pertinent labs & imaging results that were available during my care of the patient were reviewed by me and considered in my medical decision making (see chart for details).     MDM  Screen complete   Patient is presenting with multiple complaints including chest pain, fainting, and left-sided weakness.  Patient's symptoms are consistent with prior presentations.  Patient does not appear to be a candidate for TPA.  Case was discussed with neurology Otelia Limes([place]) and he has evaluated the patient in the ED.  CT head does not reveal any acute pathology.  Initial EKG is reassuring without evidence of acute ischemia.  Initial troponin is negative.  Other screening labs are without significant abnormality.   Case is discussed with the internal medicine service and they will evaluate the patient for admission.   Final Clinical Impressions(s) / ED Diagnoses   Final diagnoses:  Weakness  Chest pain, unspecified type    ED Discharge Orders    None       Wynetta FinesMessick, Peter C, MD 01/27/18 1059

## 2018-01-28 DIAGNOSIS — I2510 Atherosclerotic heart disease of native coronary artery without angina pectoris: Secondary | ICD-10-CM

## 2018-01-28 DIAGNOSIS — R55 Syncope and collapse: Secondary | ICD-10-CM

## 2018-01-28 DIAGNOSIS — R0789 Other chest pain: Secondary | ICD-10-CM | POA: Diagnosis not present

## 2018-01-28 DIAGNOSIS — R531 Weakness: Secondary | ICD-10-CM | POA: Diagnosis not present

## 2018-01-28 DIAGNOSIS — R079 Chest pain, unspecified: Secondary | ICD-10-CM | POA: Diagnosis not present

## 2018-01-28 DIAGNOSIS — Z885 Allergy status to narcotic agent status: Secondary | ICD-10-CM

## 2018-01-28 DIAGNOSIS — Z7982 Long term (current) use of aspirin: Secondary | ICD-10-CM

## 2018-01-28 DIAGNOSIS — I10 Essential (primary) hypertension: Secondary | ICD-10-CM

## 2018-01-28 DIAGNOSIS — Z91041 Radiographic dye allergy status: Secondary | ICD-10-CM

## 2018-01-28 DIAGNOSIS — Z8673 Personal history of transient ischemic attack (TIA), and cerebral infarction without residual deficits: Secondary | ICD-10-CM

## 2018-01-28 DIAGNOSIS — Z79899 Other long term (current) drug therapy: Secondary | ICD-10-CM

## 2018-01-28 DIAGNOSIS — Z955 Presence of coronary angioplasty implant and graft: Secondary | ICD-10-CM

## 2018-01-28 DIAGNOSIS — Z950 Presence of cardiac pacemaker: Secondary | ICD-10-CM

## 2018-01-28 LAB — HIV ANTIBODY (ROUTINE TESTING W REFLEX): HIV SCREEN 4TH GENERATION: NONREACTIVE

## 2018-01-28 NOTE — Progress Notes (Signed)
   Subjective: [name] was seen this morning lying comfortably in bed. He states his symptoms have mostly improved since yesterday although he still has some weakness and numbness in the left leg, arm, and face. He denies nausea, chest pain this am, SOB, dizziness, and headache.      Objective:  Vital signs in last 24 hours: Vitals:   01/27/18 1709 01/27/18 2049 01/27/18 2352 01/28/18 0512  BP: (!) 113/59 108/62 118/79 138/90  Pulse: (!) 59 60 64 79  Resp: 20 15 14 14   Temp: 97.9 F (36.6 C) 98 F (36.7 C) 98 F (36.7 C) 98.1 F (36.7 C)  TempSrc: Oral Oral Oral Oral  SpO2: 98% 99% 100% 100%  Weight:      Height:       Constitution: A&Ox3, well-developed, well-nourished, NAD, lying supine in bed HEENT: normocephalic & atraumatic, PERRLA, nml EOM bilatearlly  Cardiovascular: regular rate and rhythm. Normal heart sounds. +radial pulses bilatearlly  Pulmonary: CTAB, no respiratory distress, wheezing, rales, or rhonci Abdominal: two 2cm hematomas on abdomen, +BS, non-distended, NTTP MSK: Right UE and LE 5/5 strength and  +sensation; left UE & LE 5/5 strength, decreased sensation in LE & UE; hematoma over right forearm  Neuro: cooperative, pleasant, normal affect, CN II-IV, VI-XII intact, pt stated decreased sensation left side of face  Assessment/Plan:  Active Problems:   Left-sided weakness   Weakness  Left sided weakness: witnessed syncopal episode with LOC w/o trauma to head s/p 1 day presenting with left sided numbness and weakness in UE,LE, and face. Pt has a history of similar symptoms of varying focal weakness and chest pain dating back to 2013 with extensive workup. CT & EKG normal. MRI was not performed due to pt's pacemaker  - neurology consulted, recommended no further workup at this time - discussed d/c of eliquis until follow up with PCP, [facility] [place]   Chest pain: Stabbing chest pain on  admission with history of this sometimes occurring at home. Hx of stentsx2 placed ~ 2011/12 negative cath & angiography 2015, hx of CAD & pacemaker placement prior to 2013 - troponins negative, stop trending - follow up with pcp at discharge - cont. Home Asa & lipitor  Dispo: Anticipated discharge in approximately today.   Guinevere ScarletSeawell, Kyung Muto A, DO 01/28/2018, 2:47 PM Pager: [phone]

## 2018-01-28 NOTE — Discharge Summary (Signed)
Name: Jacob Pineda MRN: [id number] DOB: 06/05/1951 67 y.o. PCP: Default, Provider, MD  Date of Admission: 01/27/2018  7:10 AM Date of Discharge: 01/28/2018 Attending Physician: Dr. Doneen Poisson, MD  Discharge Diagnosis: 1. Syncope 2. Left-sided weakness  Discharge Medications: Allergies as of 01/28/2018      Reactions   Morphine Hives, Itching   Iodinated Diagnostic Agents Rash   Patient states he can tolerate contrast if premedicated with benadryl and steroids.  Patient states he can tolerate contrast if premedicated with benadryl and steroids.  Patient states he can tolerate contrast if premedicated with benadryl and steroids.       Medication List    STOP taking these medications   ELIQUIS PO     TAKE these medications   Acetaminophen 325 MG Caps Take 325 mg by mouth daily as needed for mild pain.   aspirin EC 81 MG tablet Take 81 mg by mouth daily.   atorvastatin 80 MG tablet Commonly known as:  LIPITOR Take 1 tablet (80 mg total) by mouth daily at 6 PM.   multivitamin with minerals Tabs tablet Take 1 tablet by mouth daily.       Disposition and follow-up:   Jacob Pineda was discharged from [facility] in Stable condition.  At the hospital follow up visit please address:  1. - Left sided weakness: confirm complete resolution of symptoms and discuss home environment, work, and possible psychologic etiologies for continuing recurrence of symptoms.  - Eliquis: follow-up with [facility] ED in [place], [place] to determine why patient was placed on eliquis.   2.  Labs / imaging needed at time of follow-up: none  3.  Pending labs/ test needing follow-up: none  Follow-up Appointments: Follow-up Information    [facility]. Call.   Why:  Please make an appointment to be seen within a week. Contact information: [address] [place] [address] [phone]          Hospital  Course by problem list: Jacob Pineda is a 67 yo male with history of pacemaker placement for symptomatic bradycardia, CAD, stent placementx2, and HTN who presented to [facility] with an episode of syncope, chest pain, and left sided weakness. Neurology was consulted, and he was worked up for possible stroke and myocardial ischemia, with negative troponin levels and no significant findings on CT, EKG, or chest x-ray. Physical exam was inconsistent per neurology and also our service. He has a history of recurrent hospital visits for similar intermittent symptoms since 2013 with extensive workup but no etiology found. Prior to admission at [facility], he was placed on eliquis in addition to his asa on a previous hospital visit to [facility] about one month ago. Pt was unsure why he was placed on eliquis, and reasoning for prescription was unclear. Attempts are being made to obtain medical records. His eliquis was held as pt had several hematomas.    Jacob Pineda was kept overnight for observation, with improvement of symptoms in the am. Neurology recommended that no further evaluation was required. He will be discharged with instructions to follow-up with the internal medicine outpatient clinic.  Discharge Vitals:   BP 138/90 (BP Location: Right Arm)   Pulse 79   Temp 98.1 F (36.7 C) (Oral)   Resp 14   Ht 5\' 11"  (1.803 m)   Wt 180 lb (81.6 kg) Comment: pt denies ever weighing 280 lbs  SpO2 100%   BMI 25.10 kg/m  Pertinent Labs, Studies, and Procedures:   CT Head 01/28/18 Patchy periventricular small vessel disease. No acute infarct evident. No mass or hemorrhage.  There are foci of arterial vascular calcification. There is mucosal thickening in several ethmoid air cells with a small osteoma in the right ethmoid region, stable. Probable cerumen in the right external auditory canal.   By: Bretta BangWilliam  Woodruff III M.D.   On: 01/27/2018 08:31   Chest x-ray 07/08 No active cardiopulmonary  disease   Discharge Instructions: Discharge Instructions    Diet - low sodium heart healthy   Complete by:  As directed    Discharge instructions   Complete by:  As directed    It was pleasure taking care of you. Please [facility] the internal medicine [facility] with the provided number to make an appointment to be seen within a week to 10 dayse . Please do not take Eliquis unless you will see your primary care so they can determine the need of that medicine. Continue taking aspirin and Lipitor.   Increase activity slowly   Complete by:  As directed       Signed: Guinevere ScarletSeawell, Jaimie A, DO 01/28/2018, 2:04 PM   Pager: [phone]

## 2018-01-28 NOTE — H&P (Signed)
Internal Medicine Attending Admission Note Date: 01/28/2018  Patient name: Jacob Pineda Medical record number: [id number] Date of birth: May 14, 1951 Age: 67 y.o. Gender: male  I saw and evaluated the patient. I reviewed the resident's note and I agree with the resident's findings and plan as documented in the resident's note.  Chief Complaint(s): Syncope, chest pain, left-sided weakness.  History - key components related to admission:  Jacob Pineda is a 67 year old man with a history of coronary artery disease status post stent placement 2, pacemaker placement, essential hypertension, and possible prior CVA who presents with the onset of syncope, left-sided weakness, and chest pain. He was in his usual state of health until the morning of admission when he suddenly lost consciousness and fell. He bruised his arm but had no other obvious injuries. Reportedly, he was out for 2 minutes. When he tried to stand he fell again, this time without losing consciousness. He also notes recent chest pain at rest associated with shortness of breath. The pain is characterized as stabbing. There is no nausea or diaphoresis associated with these pains. He apparently has had similar symptoms that have been worked up extensively in the past. More recent evaluations have been unrevealing for a cause of these symptoms. Because of his concerns he presented to the emergency department for further evaluation. Neurology evaluated the patient and felt that his physical exam was inconsistent and did not correlate with his history. They felt this did not represent a stroke and no further evaluation was required. Nonetheless, he was admitted to the internal medicine teaching service for observation given the chest pain.  When seen on rounds the morning after admission he was feeling well. He denied any further episodes of syncope or near syncope. He also denied any more chest pain or falls.  Physical Exam - key components  related to admission:  Vitals:   01/27/18 1709 01/27/18 2049 01/27/18 2352 01/28/18 0512  BP: (!) 113/59 108/62 118/79 138/90  Pulse: (!) 59 60 64 79  Resp: 20 15 14 14   Temp: 97.9 F (36.6 C) 98 F (36.7 C) 98 F (36.7 C) 98.1 F (36.7 C)  TempSrc: Oral Oral Oral Oral  SpO2: 98% 99% 100% 100%  Weight:      Height:       Gen.: Well-developed, well-nourished, pleasant man lying comfortably in bed in no acute distress. He is alert and oriented 3. Neuro: Cranial nerves II through XII intact. Sensation intact bilaterally. 4/5 strength in the left upper and left lower extremities. The exam was somewhat inconsistent. The right side was 5/5 throughout.  Lab results:  Basic Metabolic Panel: Recent Labs    01/27/18 0749  NA 141  K 3.9  CL 110  CO2 22  GLUCOSE 125*  BUN 15  CREATININE 1.19  CALCIUM 9.5   Liver Function Tests: Recent Labs    01/27/18 0749  AST 35  ALT 77*  ALKPHOS 57  BILITOT 1.2  PROT 7.1  ALBUMIN 4.1   CBC: Recent Labs    01/27/18 0749  WBC 5.9  NEUTROABS 3.7  HGB 13.7  HCT 42.7  MCV 98.4  PLT 175   Cardiac Enzymes: Recent Labs    01/27/18 2130  TROPONINI <0.03   Urinalysis:  Clear, yellow, specific gravity 1.029, pH 5.0, hemoglobin small, nitrite negative, leukocytes negative, 0-5 red blood cells per high-power field, 0-5 white blood cells per high-power field.  Imaging results:   PA and lateral chest x-ray: Personally reviewed. Pacemaker with leads  in appropriate position, 2 cardiac stents in place, no effusions, no infiltrates, and no masses.  CT of the head without contrast: Personally reviewed. No acute bleed, no mass lesions.  Other results:  EKG: Personally reviewed. Atrial paced at 73 bpm, normal axis, normal intervals, no significant Q waves, no LVH by voltage, good R wave progression, no ST or T-wave changes. Unchanged from the previous ECG on 12/13/2015.  Assessment & Plan by Problem:  [name] is a 67 year old  man with a history of coronary artery disease status post stent placement 2, pacemaker placement, essential hypertension, and possible prior CVA who presents with the onset of syncope, left-sided weakness, and chest pain. His neurologic exam was felt to be inconsistent internally as well as with his history. His head CT was negative and neurology felt no further evaluation was necessary. He ruled out for myocardial infarction with serial enzymes 12 hours apart. He's had no further symptoms and has been extensively worked up in the past for these issues. We therefore feel no further evaluation is required at this point.  1) Syncope: Pacemaker appears to be functioning well with excellent capture. He has no further symptoms of near syncope and has had no other episodes of syncope while here. Previous workup has been unremarkable.  2) Chest pain: He has underlying coronary artery disease and status post 2 stents. That said, he has ruled out for myocardial infarction with serial enzymes. This too has been extensively evaluated and no further workup is felt to be necessary at this time.  3) Left-sided weakness: Neurology has evaluated the patient and feels the exam is internally inconsistent and no further evaluation is necessary at this time beyond the head CT and observation overnight.  4) Disposition: He will be discharged home today with follow-up in the Internal Medicine Center to establish primary care and address any of his ongoing chronic medical problems.

## 2018-01-28 NOTE — Progress Notes (Signed)
Internal Medicine Attending  Date: 01/28/2018  Patient name: Jacob SchoolsDarrell W Pineda Medical record number: [id number] Date of birth: 06/09/1951 Age: 67 y.o. Gender: male  I saw and evaluated the patient. I reviewed the resident's note by Dr. Cleaster CorinSeawell and I agree with the resident's findings and plans as documented in her progress note.  Please see my H&P dated 01/28/2018 for the specifics of my evaluation, assessment, plan from earlier in the day.

## 2018-01-28 NOTE — Progress Notes (Signed)
Pt d/c home. Pt has no new concerns. Pt is stable, d/c with teach back done, pt verbalize understanding. Pt will transport self from hospital facility.

## 2018-08-12 IMAGING — DX DG CHEST 2V
2 series · 2 of 2 positions shown · non-contrast
Comparison: 12/24/2015 chest radiograph.

CLINICAL DATA: Chest pain

EXAM:
CHEST - 2 VIEW

[chest lat]
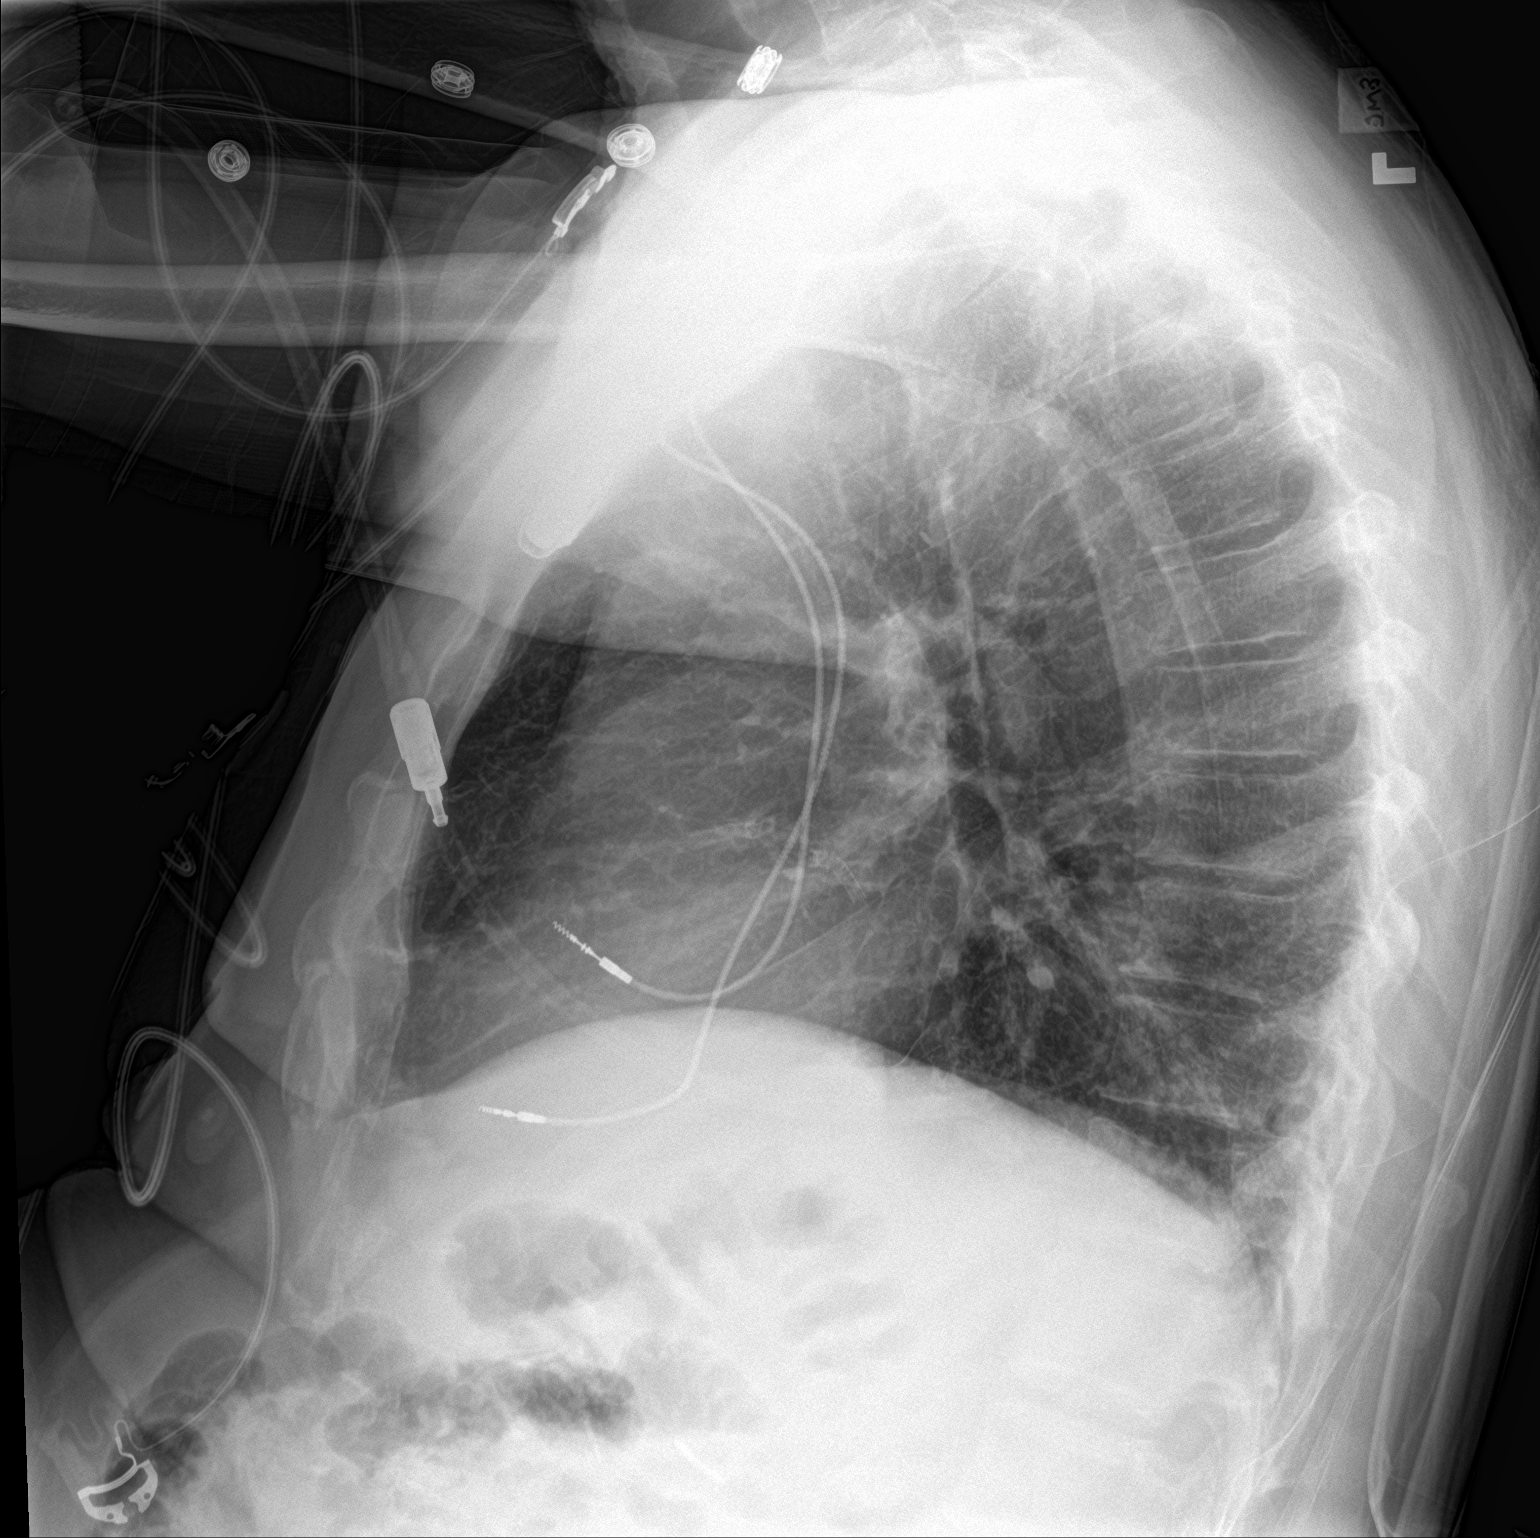

[chest ap]
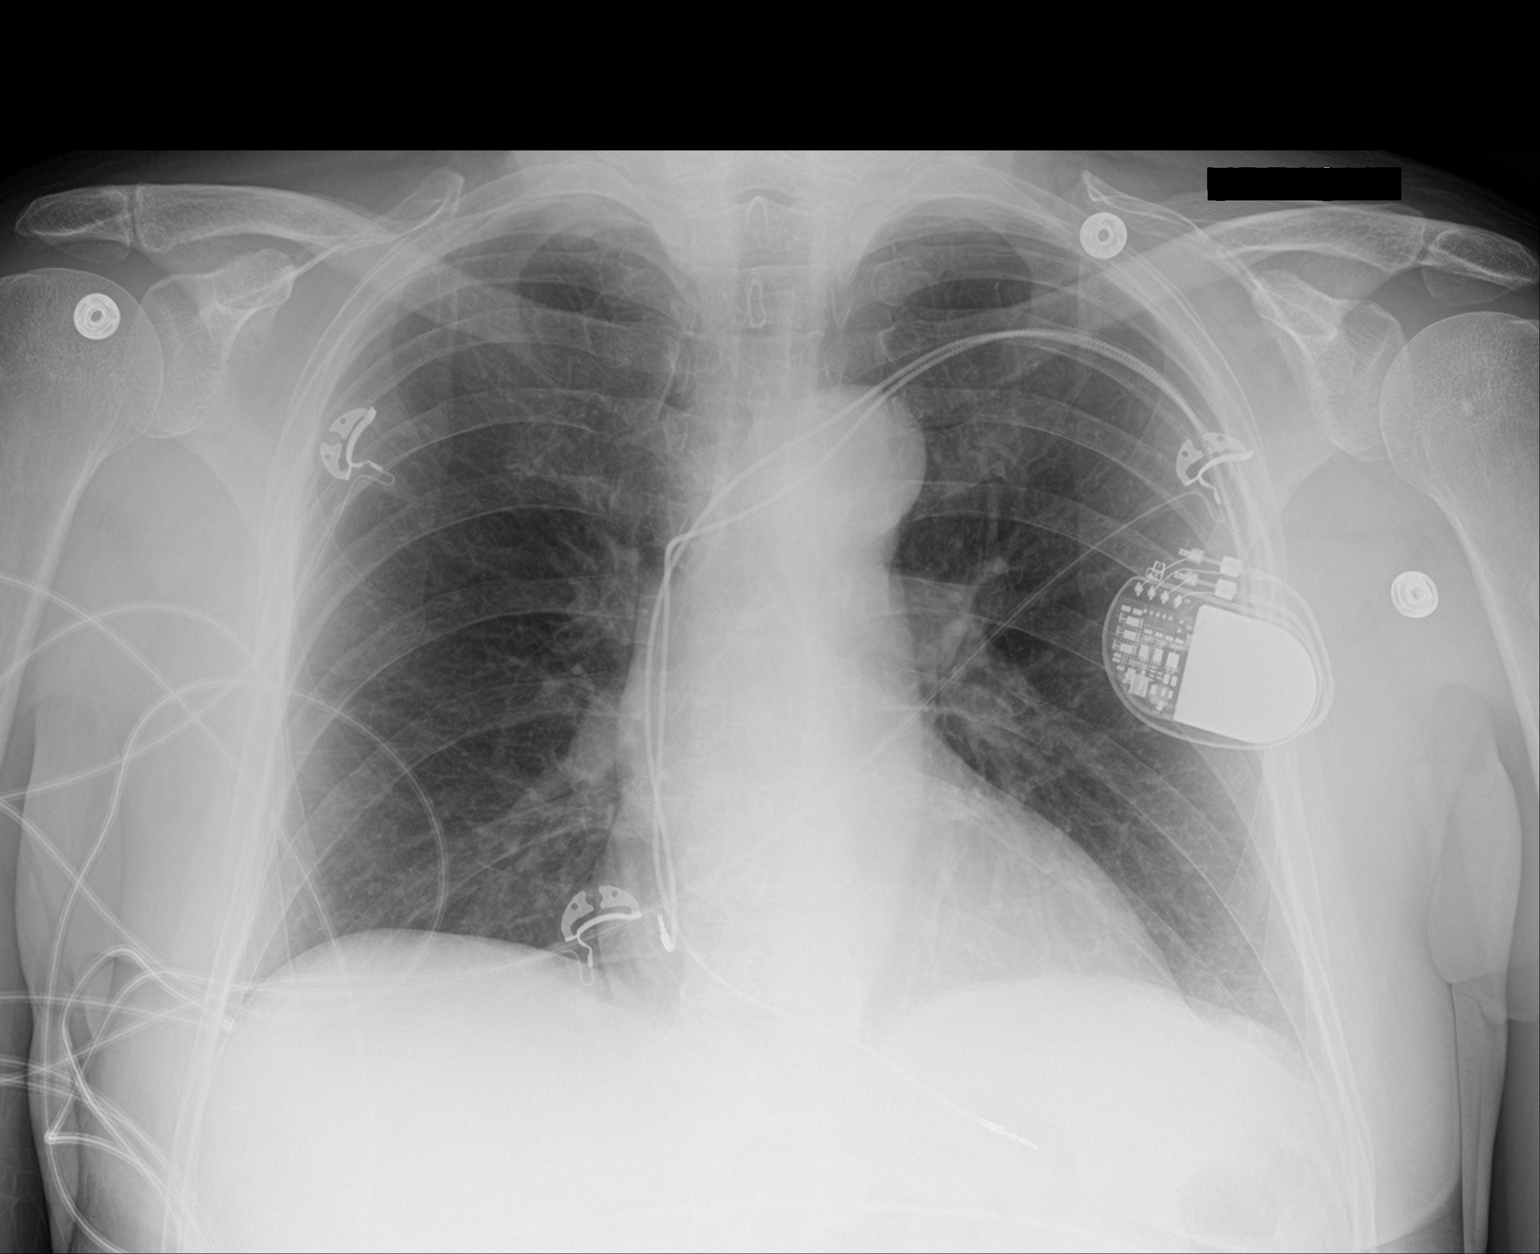

[2 of 2 positions shown; findings below may reference images not displayed]

FINDINGS: Stable configuration of 2 lead left subclavian pacemaker. Left-sided
coronary stents. Stable cardiomediastinal silhouette with normal
heart size. No pneumothorax. No pleural effusion. Lungs appear
clear, with no acute consolidative airspace disease and no pulmonary
edema.
IMPRESSION: No active cardiopulmonary disease.

## 2020-10-14 ENCOUNTER — Encounter: Payer: Self-pay | Admitting: Neurology

## 2020-10-14 ENCOUNTER — Other Ambulatory Visit: Payer: Self-pay

## 2020-10-14 ENCOUNTER — Ambulatory Visit (INDEPENDENT_AMBULATORY_CARE_PROVIDER_SITE_OTHER): Payer: Medicare HMO | Admitting: Neurology

## 2020-10-14 VITALS — BP 131/79 | HR 62 | Ht 71.0 in | Wt 203.0 lb

## 2020-10-14 DIAGNOSIS — R413 Other amnesia: Secondary | ICD-10-CM

## 2020-10-14 DIAGNOSIS — R251 Tremor, unspecified: Secondary | ICD-10-CM | POA: Diagnosis not present

## 2020-10-14 NOTE — Progress Notes (Signed)
Chief Complaint  Patient presents with  . New Patient (Initial Visit)    Reports tremor in right hand and arm. Symptoms have been slowly getting worse over the last six years. He is having difficulty eating, drinking and writing. History of two previous strokes.      ASSESSMENT AND PLAN  Jacob Pineda is a 70 y.o. male   Tremor  There was no significant parkinsonian features  Most consistent with essential tremor   Cognitive impairment  Mini-Mental Status Examination 19/30  With evidence of short-term memory loss, visual spatial disorientation  Evaluation to rule out treatable etiology  Poor social support  Reported history of stroke   CT head without contrast, not MRI candidate due to pacemaker placement  DIAGNOSTIC DATA (LABS, IMAGING, TESTING) - I reviewed patient records, labs, notes, testing and imaging myself where available.  CT head without contrast July 2019: Mild generalized atrophy, supratentorium small vessel disease  Laboratory evaluation in 2021: A1c 6.0, normal CMP, creatinine 0.93, normal TSH, vitamin D of 18, LDL of 39,  CT angiogram of head and neck in May 2017, no large vessel disease, abnormal has plaque within the cavernous internal carotid artery with mild diffuse narrowing, intracranial atherosclerotic disease  CT head in May 2017, no intracranial hemorrhage, moderate supratentorium small vessel disease  HISTORICAL  Jacob Pineda is a 70 year old male, seen in request by his primary care physician Dr. Clelia Croft, Titus Dubin, for evaluation of tremor, initial evaluation was on October 14, 2020.  I reviewed and summarized the referring note.  Past medical history Hyperlipidemia Coronary artery disease Anxiety Pacemaker placement since 2012. 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  He denies loss sense of smell, denies memory loss, denies constipation, he lives alone, denies significant memory loss but today's Mini-Mental Status Examination showed significant difficulty, he only has seventh grade, could not spell world backwards, also missed 3 out of 3 short-term recall, has difficulty copy design, Mini-Mental Status Examination 19 out of 30   REVIEW OF SYSTEMS: Full 14 system review of systems performed and notable only for as above All other review of systems were negative.  PHYSICAL EXAM   Vitals:   10/14/20 0758  BP: 131/79  Pulse: 62  Weight: 203 lb (92.1 kg)  Height: 5\' 11"  (1.803 m)   Not recorded     Body mass index is 28.31 kg/m.  PHYSICAL EXAMNIATION:  Gen: NAD, conversant, well nourised, well groomed                     Cardiovascular: Regular rate rhythm, no peripheral edema, warm, nontender. Eyes: Conjunctivae clear without exudates or hemorrhage Neck: Supple, no carotid bruits. Pulmonary: Clear to auscultation bilaterally   NEUROLOGICAL EXAM:  MENTAL STATUS:   MMSE - Mini Mental State Exam 10/16/2020  Orientation to time 5  Orientation to Place 4  Registration 3  Attention/ Calculation 0  Recall 0  Language- name 2 objects 2  Language- repeat 1  Language- follow 3 step command 3  Language- read &  follow direction 1  Write a sentence 0  Copy design 0  Total score 19   CRANIAL NERVES: CN II: Visual fields are full to confrontation. Pupils are round equal and briskly reactive to light. CN III, IV, VI: extraocular movement are normal. No ptosis. CN V: Facial sensation is intact to light touch CN VII: Face is symmetric with normal eye closure  CN VIII: Hearing is normal  to causal conversation. CN IX, X: Phonation is normal. CN XI: Head turning and shoulder shrug are intact  MOTOR: Fixation of left upper extremity upon rapid rotating movement  REFLEXES: Reflexes are 2+ and symmetric at the biceps, triceps, knees, and ankles. Plantar responses are flexor.  SENSORY: Intact to light touch, pinprick and vibratory sensation are intact in fingers and toes.  COORDINATION: There is no trunk or limb dysmetria noted.  GAIT/STANCE: He can get up from seated position arm crossed, pointing left leg towards outside, likely dragging left leg  ALLERGIES: Allergies  Allergen Reactions  . Codeine     Other reaction(s): Hives/Swelling-Allergy  . Morphine Hives and Itching  . Shellfish Allergy Anaphylaxis  . Iodinated Diagnostic Agents Rash    Patient states he can tolerate contrast if premedicated with benadryl and steroids.       HOME MEDICATIONS: Current Outpatient Medications  Medication Sig Dispense Refill  . Acetaminophen 325 MG CAPS Take 325 mg by mouth daily as needed for mild pain.     Marland Kitchen ALPRAZolam (XANAX) 0.5 MG tablet Take 0.5 tablets by mouth 3 (three) times daily as needed for anxiety.    . clopidogrel (PLAVIX) 75 MG tablet Take 75 mg by mouth daily.    . Multiple Vitamin (MULTIVITAMIN WITH MINERALS) TABS Take 1 tablet by mouth daily.     No current facility-administered medications for this visit.    PAST MEDICAL HISTORY: Past Medical History:  Diagnosis Date  . Anxiety   . Atrial fibrillation (HCC)   . CAD (coronary artery disease)   . Coronary artery disease   . Diastolic dysfunction   . GERD (gastroesophageal reflux disease)   . Hypercholesteremia   . Pacemaker   . Stroke (HCC)    x 2     PAST SURGICAL HISTORY: Past Surgical History:  Procedure Laterality Date  . cardiac stents times 2    . CHOLECYSTECTOMY    . INGUINAL HERNIA REPAIR    . LEFT HEART CATHETERIZATION WITH CORONARY ANGIOGRAM N/A 07/25/2013   Procedure: LEFT  HEART CATHETERIZATION WITH CORONARY ANGIOGRAM;  Surgeon: Laurey Morale, MD;  Location: [facility] CATH LAB;  Service: Cardiovascular;  Laterality: N/A;  . PACEMAKER INSERTION      FAMILY HISTORY: Family History  Problem Relation Age of Onset  . Breast cancer Mother   . Other Father        unsure of medical hx - died of "old age"    SOCIAL HISTORY: Social History   Socioeconomic History  . Marital status: Widowed    Spouse name: Not on file  . Number of children: 0  . Years of education: 7th grade  . Highest education level: Not on file  Occupational History  . Occupation: works in Catering manager  . Smoking status: Former Games developer  . Smokeless tobacco: Never Used  Vaping Use  . Vaping Use: Not on file  Substance and Sexual Activity  . Alcohol use: No  . Drug use: No  . Sexual activity: Yes  Other Topics Concern  . Not on file  Social History Narrative   Lives alone.   His only daughter was killed in a car accident at age 70.   Right-handed.   Caffeine use: 2 cups per day.   Social Determinants of Health   Financial Resource Strain: Not on file  Food Insecurity: Not on file  Transportation Needs: Not on file  Physical Activity: Not on file  Stress: Not on file  Social Connections: Not on file  Intimate Partner Violence: Not on file      Levert Feinstein, M.D. Ph.D.  [facility] [address] [place], [place] [address] Ph: [phone] Fax: [phone]  CC:  Evlyn Clines, MD [address] [place],  [place] [address]  Cleatis Polka., MD

## 2020-10-15 LAB — RPR, QUANT+TP ABS (REFLEX): Rapid Plasma Reagin, Quant: 1:2 {titer} — ABNORMAL HIGH

## 2020-10-15 LAB — VITAMIN B12: Vitamin B-12: 218 pg/mL — ABNORMAL LOW (ref 232–1245)

## 2020-10-16 LAB — RPR, QUANT+TP ABS (REFLEX): T Pallidum Abs: REACTIVE — AB

## 2020-10-16 LAB — TSH: TSH: 3.56 u[IU]/mL (ref 0.450–4.500)

## 2020-10-16 LAB — FOLATE: Folate: 8.8 ng/mL (ref >3.0)

## 2020-10-16 LAB — C-REACTIVE PROTEIN: CRP: 3 mg/L (ref 0–10)

## 2020-10-16 LAB — HGB A1C W/O EAG: Hgb A1c MFr Bld: 6.1 % — ABNORMAL HIGH (ref 4.8–5.6)

## 2020-10-16 LAB — SEDIMENTATION RATE: Sed Rate: 2 mm/hr (ref 0–30)

## 2020-10-16 LAB — RPR: RPR Ser Ql: REACTIVE — AB

## 2020-10-18 ENCOUNTER — Telehealth: Payer: Self-pay | Admitting: Neurology

## 2020-10-18 NOTE — Telephone Encounter (Signed)
The patient is aware of his lab results. He denies any known exposure to syphilis.   He does not have any family members or friends who can care for him. He has to get an Benedetto Goad ride to his appts. He is agreeable to the B12 but will need to take an oral supplement due to his lack of transportation.  Per vo by Dr. Terrace Arabia, she will see him back in the office for another visit and hold off on the LP at this moment. He does not have anyone to drive him or help after the procedure.

## 2020-10-18 NOTE — Telephone Encounter (Signed)
Jacob Pineda: [id number] (exp. 10/18/20 to 11/17/20) order sent to GI. They will reach out to the patient to schedule.

## 2020-10-18 NOTE — Telephone Encounter (Signed)
Please call patient, laboratory evaluation showed mild elevated A1c 6.1, indicating mild elevated glucose level of the past few months, he should start by diet control, exercise  Low B12 level 218, better have IM B12 supplement, this can be done by primary care's office, if he has transportation issues, it is okay to take over-the-counter vitamin B12 supplement 1000 mcg daily, and repeat laboratory evaluation in 6 to 12 months  In addition RPR was positive with titer 1:2, also positive for treponemal pallidum antibody  Please ask him if he has a history of syphilis infection, if not, above test would warrant further evaluation, I would recommend lumbar puncture,

## 2020-10-25 ENCOUNTER — Other Ambulatory Visit: Payer: Medicare Other

## 2020-10-25 ENCOUNTER — Other Ambulatory Visit: Payer: Self-pay

## 2020-10-25 ENCOUNTER — Ambulatory Visit
Admission: RE | Admit: 2020-10-25 | Discharge: 2020-10-25 | Disposition: A | Discharge status: Home / Self Care | Payer: Medicare HMO | Source: Ambulatory Visit | Attending: Neurology | Admitting: Neurology

## 2020-10-25 DIAGNOSIS — R251 Tremor, unspecified: Secondary | ICD-10-CM | POA: Diagnosis not present

## 2020-10-25 DIAGNOSIS — R413 Other amnesia: Secondary | ICD-10-CM

## 2020-10-26 ENCOUNTER — Telehealth: Payer: Self-pay | Admitting: Neurology

## 2020-10-26 NOTE — Telephone Encounter (Signed)
I spoke to the patient and notified him of the CT head results. He did not have any questions.

## 2020-10-26 NOTE — Telephone Encounter (Signed)
  IMPRESSION: This CT scan of the head without contrast shows the following: 1.   Generalized cortical atrophy, mildly more than typical for age.  This appears stable compared to the previous CT scan from 2020. 2.   Mild stable age-related chronic microvascular ischemic changes. 3.   Mild stable calcification in the arteries at the skull base 4.   No acute findings.  Please call patient, CT head showed no acute abnormality, stable generalized atrophy, and supratentorial and small vessel disease

## 2020-11-03 ENCOUNTER — Telehealth: Payer: Self-pay

## 2020-11-03 NOTE — Telephone Encounter (Signed)
We received a call from this patient asking questions about his upcoming appointment on 4/28.  I let him know I would send a note back and that Marcelino Duster, Dr Catalina Gravel nurse would give him a call back to discuss his appointment.

## 2020-11-03 NOTE — Telephone Encounter (Signed)
I returned the call. The patient just wanted to confirm his appt time. He was provided with the information.

## 2020-11-18 ENCOUNTER — Other Ambulatory Visit: Payer: Self-pay

## 2020-11-18 ENCOUNTER — Encounter: Payer: Self-pay | Admitting: Neurology

## 2020-11-18 ENCOUNTER — Ambulatory Visit (INDEPENDENT_AMBULATORY_CARE_PROVIDER_SITE_OTHER): Payer: Medicare HMO | Admitting: Neurology

## 2020-11-18 VITALS — BP 129/90 | HR 80 | Ht 71.0 in | Wt 198.5 lb

## 2020-11-18 DIAGNOSIS — R4189 Other symptoms and signs involving cognitive functions and awareness: Secondary | ICD-10-CM | POA: Diagnosis not present

## 2020-11-18 DIAGNOSIS — R251 Tremor, unspecified: Secondary | ICD-10-CM | POA: Diagnosis not present

## 2020-11-18 MED ORDER — PROPRANOLOL HCL ER 60 MG PO CP24: 60.0000 mg | ORAL_CAPSULE | Freq: Every day | ORAL | 11 refills | Status: AC

## 2020-11-18 NOTE — Patient Instructions (Signed)
To increase the amount of vitamin B12 in your diet, eat more of foods that contain it, such as:  Beef, liver, and chicken.  Fish and shellfish such as trout, salmon, tuna fish, and clams.  Fortified breakfast cereal.  Low-fat milk, yogurt, and cheese.  Eggs.

## 2020-11-18 NOTE — Progress Notes (Signed)
Chief Complaint  Patient presents with  . Follow-up    He is taking B12 daily now. Tremors are still an issue (hard time eating and drinking). Inquiring if there are medications that would help them. Recent MMSE 19/30. He would like to review his lab and CT head results. He admits today to having Syphilis in the 90's. He took several rounds of penicillin at the time.       ASSESSMENT AND PLAN  Jacob Pineda is a 70 y.o. male   Tremor  There was no significant parkinsonian features  Most consistent with essential tremor  Add  Inderal LA 60 units daily   Cognitive impairment  Mini-Mental Status Examination 19/30  With evidence of short-term memory loss, visual spatial disorientation  CT had showed evidence of moderate supratentorium small vessel disease  Also had a history of syphilis, received treatment in 1990s, low titer positive RPR,  Poor social support  His memory loss are most likely central nervous system degenerative disorder, with a vascular component, less likely central nervous system syphilis, also mild low vitamin B12, is on B12 supplement     DIAGNOSTIC DATA (LABS, IMAGING, TESTING) - I reviewed patient records, labs, notes, testing and imaging myself where available.  CT head without contrast July 2019: Mild generalized atrophy, supratentorium small vessel disease  Laboratory evaluation in 2021: A1c 6.0, normal CMP, creatinine 0.93, normal TSH, vitamin D of 18, LDL of 39,  CT angiogram of head and neck in May 2017, no large vessel disease, abnormal has plaque within the cavernous internal carotid artery with mild diffuse narrowing, intracranial atherosclerotic disease  CT head in May 2017, no intracranial hemorrhage, moderate supratentorium small vessel disease  HISTORICAL  Jacob Pineda is a 70 year old male, seen in request by his primary care physician Dr. Clelia Croft, Titus Dubin, for evaluation of tremor, initial evaluation was on October 14, 2020.  I reviewed and summarized the referring note.  Past medical history Hyperlipidemia Coronary artery disease Anxiety Pacemaker placement since 2012. 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  He denies loss sense of smell, denies memory loss, denies constipation, he lives alone, denies significant memory loss but today's Mini-Mental Status Examination showed significant difficulty, he only has seventh grade, could not spell world backwards, also missed 3 out of 3 short-term recall, has difficulty copy design, Mini-Mental Status Examination 19 out of 30  UPDATE November 18 2020: He took Forensic scientist at today's visit, he denies significant memory loss, reported long history of difficulty reading, writing, only finish 5 years of school, Mini-Mental Status Examination was only 19 out of 30, on today's attempt, he was noted to have significant copy simple design,  CT head reviewed with patient, generalized atrophy, patchy periventricular small vessel disease no acute abnormality  He is still working part-time job Holiday representative work, no longer driving, he desires treatment for his bilateral hands tremor, described difficulty using utensils, his tremor most consistent with essential tremor  Laboratory evaluation in March 2022 showed positive RPR, with positive T pallidum antibody, also  B12 deficiency 218, he is now on p.o. supplement, normal TSH, CMP, CMP CMP, CBC  Reported history of syphilis treatment in 1990s REVIEW OF SYSTEMS: Full 14 system review of systems performed and notable  only for as above All other review of systems were negative.  PHYSICAL EXAM   Vitals:   11/18/20 0803  BP: 129/90  Pulse: 80  Weight: 198 lb 8 oz (90 kg)  Height: 5\' 11"  (1.803 m)   Not recorded     Body mass index is 27.69 kg/m.  PHYSICAL EXAMNIATION:  Gen: NAD, conversant, well nourised, well groomed                     Cardiovascular: Regular rate rhythm, no peripheral edema, warm, nontender. Eyes: Conjunctivae clear without exudates or hemorrhage Neck: Supple, no carotid bruits. Pulmonary: Clear to auscultation bilaterally   NEUROLOGICAL EXAM:  MENTAL STATUS:   MMSE - Mini Mental State Exam 10/16/2020  Orientation to time 5  Orientation to Place 4  Registration 3  Attention/ Calculation 0  Recall 0  Language- name 2 objects 2  Language- repeat 1  Language- follow 3 step command 3  Language- read & follow direction 1  Write a sentence 0  Copy design 0  Total score 19   CRANIAL NERVES: CN II: Visual fields are full to confrontation. Pupils are round equal and briskly reactive to light. CN III, IV, VI: extraocular movement are normal. No ptosis. CN V: Facial sensation is intact to light touch CN VII: Face is symmetric with normal eye closure  CN VIII: Hearing is normal to causal conversation. CN IX, X: Phonation is normal. CN XI: Head turning and shoulder shrug are intact  MOTOR: Fixation of left upper extremity upon rapid rotating movement  REFLEXES: Reflexes are 2+ and symmetric at the biceps, triceps, knees, and ankles. Plantar responses are flexor.  SENSORY: Intact to light touch, pinprick and vibratory sensation are intact in fingers and toes.  COORDINATION: There is no trunk or limb dysmetria noted.  GAIT/STANCE: He can get up from seated position arm crossed, pointing left leg towards outside, likely dragging left leg  ALLERGIES: Allergies  Allergen Reactions  . Codeine     Other reaction(s): Hives/Swelling-Allergy  . Morphine Hives and  Itching  . Shellfish Allergy Anaphylaxis  . Iodinated Diagnostic Agents Rash    Patient states he can tolerate contrast if premedicated with benadryl and steroids.       HOME MEDICATIONS: Current Outpatient Medications  Medication Sig Dispense Refill  . Acetaminophen 325 MG CAPS Take 325 mg by mouth daily as needed for mild pain.     10/18/2020 ALPRAZolam (XANAX) 0.5 MG tablet Take 0.5 tablets by mouth 3 (three) times daily as needed for anxiety.    . clopidogrel (PLAVIX) 75 MG tablet Take 75 mg by mouth daily.    . Multiple Vitamin (MULTIVITAMIN WITH MINERALS) TABS Take 1 tablet by mouth daily.    . vitamin B-12 (CYANOCOBALAMIN) 1000 MCG tablet Take 1,000 mcg by mouth daily.     No current facility-administered medications for this visit.    PAST MEDICAL HISTORY: Past Medical History:  Diagnosis Date  . Anxiety   . Atrial fibrillation (HCC)   . CAD (coronary artery disease)   . Coronary artery disease   . Diastolic dysfunction   . GERD (gastroesophageal reflux disease)   . Hypercholesteremia   . Pacemaker   . Stroke (HCC)    x 2  PAST SURGICAL HISTORY: Past Surgical History:  Procedure Laterality Date  . cardiac stents times 2    . CHOLECYSTECTOMY    . INGUINAL HERNIA REPAIR    . LEFT HEART CATHETERIZATION WITH CORONARY ANGIOGRAM N/A 07/25/2013   Procedure: LEFT HEART CATHETERIZATION WITH CORONARY ANGIOGRAM;  Surgeon: Laurey Morale, MD;  Location: [facility] CATH LAB;  Service: Cardiovascular;  Laterality: N/A;  . PACEMAKER INSERTION      FAMILY HISTORY: Family History  Problem Relation Age of Onset  . Breast cancer Mother   . Other Father        unsure of medical hx - died of "old age"    SOCIAL HISTORY: Social History   Socioeconomic History  . Marital status: Widowed    Spouse name: Not on file  . Number of children: 0  . Years of education: 7th grade  . Highest education level: Not on file  Occupational History  . Occupation: works in Catering manager   . Smoking status: Former Games developer  . Smokeless tobacco: Never Used  Vaping Use  . Vaping Use: Not on file  Substance and Sexual Activity  . Alcohol use: No  . Drug use: No  . Sexual activity: Yes  Other Topics Concern  . Not on file  Social History Narrative   Lives alone.   His only daughter was killed in a car accident at age 11.   Right-handed.   Caffeine use: 2 cups per day.   Social Determinants of Health   Financial Resource Strain: Not on file  Food Insecurity: Not on file  Transportation Needs: Not on file  Physical Activity: Not on file  Stress: Not on file  Social Connections: Not on file  Intimate Partner Violence: Not on file      Levert Feinstein, M.D. Ph.D.  [facility] [address], [address] [place], [place] [address] Ph: [phone] Fax: [phone]  CC:  Cleatis Polka., MD [address] [place],  [place] [address]  Cleatis Polka., MD

## 2021-05-18 ENCOUNTER — Encounter: Payer: Self-pay | Admitting: Neurology

## 2021-05-23 ENCOUNTER — Telehealth: Payer: Self-pay | Admitting: Neurology

## 2021-05-23 NOTE — Telephone Encounter (Signed)
Pt called, seeing a neurologist in [place] closer to home.

## 2021-06-08 ENCOUNTER — Ambulatory Visit: Payer: Medicare HMO | Admitting: Neurology

## 2021-11-17 ENCOUNTER — Other Ambulatory Visit: Payer: Self-pay | Admitting: Neurology
